# Patient Record
Sex: Female | Born: 1975 | Race: Asian | Hispanic: No | Marital: Married | Smoking: Never smoker
Health system: Southern US, Community
[De-identification: ages and names within clinical notes are randomized; demographics above are authoritative.]

## PROBLEM LIST (undated history)

## (undated) DIAGNOSIS — Z789 Other specified health status: Secondary | ICD-10-CM

---

## 2015-08-03 NOTE — H&P (Signed)
  Julie Fry is a 39 y.o. female here for Initial Prenatal Visit .missed ab documented on u/s today9 week fetus , no FHM   Past Medical History:  has no past medical history on file.  Past Surgical History:  has no past surgical history on file. Family History: family history includes No Known Problems in her father and mother. Social History:  reports that she has never smoked. She does not have any smokeless tobacco history on file. She reports that she does not drink alcohol or use illicit drugs. OB/GYN History:  OB History    Gravida Para Term Preterm AB TAB SAB Ectopic Multiple Living   2 1 1  0 0 0 0 0 0 1      Allergies: has No Known Allergies. Medications:  Current Outpatient Prescriptions:  . prenatal vitamin-iron-FA-DHA (PRENATE DHA) 27-1-300 mg capsule, Take 1 capsule by mouth once daily., Disp: , Rfl:   Review of Systems: General:   No fatigue or weight loss Eyes:   No vision changes Ears:   No hearing difficulty Respiratory:   No cough or shortness of breath Pulmonary:   No asthma or shortness of breath Cardiovascular:  No chest pain, palpitations, dyspnea on exertion Gastrointestinal:  No abdominal bloating, chronic diarrhea, constipations, masses, pain or hematochezia Genitourinary:  No hematuria, dysuria, abnormal vaginal discharge, pelvic pain, Menometrorrhagia Lymphatic:  No swollen lymph nodes Musculoskeletal: No muscle weakness Neurologic:  No extremity weakness, syncope, seizure disorder Psychiatric:  No history of depression, delusions or suicidal/homicidal ideation   Exam:      Vitals:   08/03/15 0818  BP: 105/64  Pulse: 67    Body mass index is 21.61 kg/(m^2).  WDWN asian  female in NAD  Lungs: CTA  CV : RRR without murmur  Breast: exam done in sitting and lying position : No dimpling or retraction, no dominant mass, no spontaneous discharge, no axillary adenopathy Neck: no  thyromegaly Abdomen: soft , no mass, normal active bowel sounds, non-tender, no rebound tenderness Pelvic: tanner stage 5 ,  External genitalia: vulva /labia no lesions Urethra: no prolapse Vagina: normal physiologic d/c Cervix: no lesions, no cervical motion tenderness  Uterus: 10 weeksAdnexa: no mass, non-tender   Impression:   Missed Abortion  Spoke to husband Facilities manager) and the pt about the findings .    Plan:    Suction D+C . Risks of the procedure d/w the pt

## 2015-08-07 ENCOUNTER — Ambulatory Visit: Payer: BLUE CROSS/BLUE SHIELD | Admitting: Anesthesiology

## 2015-08-07 ENCOUNTER — Ambulatory Visit
Admission: RE | Admit: 2015-08-07 | Discharge: 2015-08-07 | Disposition: A | Discharge status: Home / Self Care | Payer: BLUE CROSS/BLUE SHIELD | Source: Ambulatory Visit | Attending: Obstetrics and Gynecology | Admitting: Obstetrics and Gynecology

## 2015-08-07 ENCOUNTER — Encounter: Payer: Self-pay | Admitting: *Deleted

## 2015-08-07 ENCOUNTER — Encounter
Admission: RE | Disposition: A | Discharge status: Home / Self Care | Payer: Self-pay | Source: Ambulatory Visit | Attending: Obstetrics and Gynecology

## 2015-08-07 ENCOUNTER — Other Ambulatory Visit: Payer: Self-pay

## 2015-08-07 DIAGNOSIS — O021 Missed abortion: Secondary | ICD-10-CM | POA: Insufficient documentation

## 2015-08-07 HISTORY — PX: DILATION AND EVACUATION: SHX1459

## 2015-08-07 LAB — TYPE AND SCREEN
ABO/RH(D): O POS
Antibody Screen: NEGATIVE

## 2015-08-07 LAB — BASIC METABOLIC PANEL
Anion gap: 3 — ABNORMAL LOW (ref 5–15)
BUN: 8 mg/dL (ref 6–20)
CALCIUM: 9.3 mg/dL (ref 8.9–10.3)
CO2: 28 mmol/L (ref 22–32)
CREATININE: 0.45 mg/dL (ref 0.44–1.00)
Chloride: 105 mmol/L (ref 101–111)
GFR calc Af Amer: >60 mL/min (ref >60)
GFR calc non Af Amer: >60 mL/min (ref >60)
GLUCOSE: 97 mg/dL (ref 65–99)
Potassium: 3.5 mmol/L (ref 3.5–5.1)
Sodium: 136 mmol/L (ref 135–145)

## 2015-08-07 LAB — CBC
HEMATOCRIT: 41.2 % (ref 35.0–47.0)
Hemoglobin: 13.8 g/dL (ref 12.0–16.0)
MCH: 29.4 pg (ref 26.0–34.0)
MCHC: 33.6 g/dL (ref 32.0–36.0)
MCV: 87.4 fL (ref 80.0–100.0)
Platelets: 251 10*3/uL (ref 150–440)
RBC: 4.71 MIL/uL (ref 3.80–5.20)
RDW: 13.4 % (ref 11.5–14.5)
WBC: 8.4 10*3/uL (ref 3.6–11.0)

## 2015-08-07 LAB — ABO/RH: ABO/RH(D): O POS

## 2015-08-07 SURGERY — DILATION AND EVACUATION, UTERUS
Anesthesia: General

## 2015-08-07 MED ORDER — METHYLERGONOVINE MALEATE 0.2 MG/ML IJ SOLN
INTRAMUSCULAR | Status: DC | PRN
  Administered 2015-08-07: 0.2 mg via INTRAMUSCULAR

## 2015-08-07 MED ORDER — ONDANSETRON HCL 4 MG/2ML IJ SOLN
INTRAMUSCULAR | Status: DC | PRN
  Administered 2015-08-07: 4 mg via INTRAVENOUS

## 2015-08-07 MED ORDER — METHYLERGONOVINE MALEATE 0.2 MG/ML IJ SOLN
INTRAMUSCULAR | Status: AC
  Filled 2015-08-07: qty 1

## 2015-08-07 MED ORDER — LIDOCAINE HCL (CARDIAC) 20 MG/ML IV SOLN
INTRAVENOUS | Status: DC | PRN
  Administered 2015-08-07: 30 mg via INTRAVENOUS

## 2015-08-07 MED ORDER — LACTATED RINGERS IV SOLN
INTRAVENOUS | Status: DC
  Administered 2015-08-07 (×2): via INTRAVENOUS

## 2015-08-07 MED ORDER — METHYLERGONOVINE MALEATE 0.2 MG/ML IJ SOLN: 0.2000 mg | Freq: Once | INTRAMUSCULAR | Status: DC

## 2015-08-07 MED ORDER — SILVER NITRATE-POT NITRATE 75-25 % EX MISC
CUTANEOUS | Status: AC
  Filled 2015-08-07: qty 6

## 2015-08-07 MED ORDER — MIDAZOLAM HCL 2 MG/2ML IJ SOLN
INTRAMUSCULAR | Status: DC | PRN
Start: 2015-08-07 — End: 2015-08-07
  Administered 2015-08-07: 2 mg via INTRAVENOUS

## 2015-08-07 MED ORDER — DEXAMETHASONE SODIUM PHOSPHATE 4 MG/ML IJ SOLN
INTRAMUSCULAR | Status: DC | PRN
  Administered 2015-08-07: 4 mg via INTRAVENOUS

## 2015-08-07 MED ORDER — FENTANYL CITRATE (PF) 100 MCG/2ML IJ SOLN
INTRAMUSCULAR | Status: DC | PRN
  Administered 2015-08-07: 100 ug via INTRAVENOUS

## 2015-08-07 MED ORDER — PROPOFOL 10 MG/ML IV BOLUS
INTRAVENOUS | Status: DC | PRN
Start: 2015-08-07 — End: 2015-08-07
  Administered 2015-08-07: 150 mg via INTRAVENOUS

## 2015-08-07 MED ORDER — KETOROLAC TROMETHAMINE 30 MG/ML IJ SOLN
INTRAMUSCULAR | Status: DC | PRN
  Administered 2015-08-07: 30 mg via INTRAVENOUS

## 2015-08-07 SURGICAL SUPPLY — 20 items
CATH ROBINSON RED A/P 16FR (CATHETERS) ×2 IMPLANT
FILTER UTR ASPR SPEC (MISCELLANEOUS) ×1 IMPLANT
FLTR UTR ASPR SPEC (MISCELLANEOUS) ×2
GLOVE BIO SURGEON STRL SZ8 (GLOVE) ×2 IMPLANT
GOWN STRL REUS W/ TWL LRG LVL3 (GOWN DISPOSABLE) ×1 IMPLANT
GOWN STRL REUS W/ TWL XL LVL3 (GOWN DISPOSABLE) ×1 IMPLANT
GOWN STRL REUS W/TWL LRG LVL3 (GOWN DISPOSABLE) ×1
GOWN STRL REUS W/TWL XL LVL3 (GOWN DISPOSABLE) ×1
KIT RM TURNOVER CYSTO AR (KITS) ×2 IMPLANT
PACK DNC HYST (MISCELLANEOUS) ×2 IMPLANT
PAD OB MATERNITY 4.3X12.25 (PERSONAL CARE ITEMS) ×2 IMPLANT
PAD PREP 24X41 OB/GYN DISP (PERSONAL CARE ITEMS) ×2 IMPLANT
SET BERKELEY SUCTION TUBING (SUCTIONS) ×2 IMPLANT
TOWEL OR 17X26 4PK STRL BLUE (TOWEL DISPOSABLE) ×2 IMPLANT
VACURETTE 10 RIGID CVD (CANNULA) ×2 IMPLANT
VACURETTE 6 ASPIR F TIP BERK (CANNULA) ×2 IMPLANT
VACURETTE 7MM F TIP (CANNULA) ×1
VACURETTE 7MM F TIP STRL (CANNULA) ×1 IMPLANT
VACURETTE 8 RIGID CVD (CANNULA) ×2 IMPLANT
VACURETTE 8MM F TIP (MISCELLANEOUS) ×2 IMPLANT

## 2015-08-07 NOTE — Brief Op Note (Signed)
08/07/2015  2:44 PM  PATIENT:  Brayton Caves Hengel  39 y.o. female  PRE-OPERATIVE DIAGNOSIS:  MISSED AB  POST-OPERATIVE DIAGNOSIS:  Same as above  PROCEDURE:  Dilation and curretage SURGEON:  Surgeon(s) and Role:    Boykin Nearing, MD - Primary  PHYSICIAN ASSISTANT:   ASSISTANTS: none   ANESTHESIA:   GETA  EBL:  Total I/O In: 600 [I.V.:600] Out: 20 [Blood:20]  BLOOD ADMINISTERED:none  DRAINS: none   LOCAL MEDICATIONS USED:  NONE  SPECIMEN:  Source of Specimen:  products of conception   DISPOSITION OF SPECIMEN:  PATHOLOGY  COUNTS:  YES  TOURNIQUET:  * No tourniquets in log *  DICTATION: .Other Dictation: Dictation Number dictated  PLAN OF CARE: Discharge to home after PACU  PATIENT DISPOSITION:  PACU - hemodynamically stable.   Delay start of Pharmacological VTE agent (>24hrs) due to surgical blood loss or risk of bleeding: not applicable

## 2015-08-07 NOTE — Discharge Instructions (Signed)
AMBULATORY SURGERY  DISCHARGE INSTRUCTIONS   1) The drugs that you were given will stay in your system until tomorrow so for the next 24 hours you should not:  A) Drive an automobile B) Make any legal decisions C) Drink any alcoholic beverage   2) You may resume regular meals tomorrow.  Today it is better to start with liquids and gradually work up to solid foods.  You may eat anything you prefer, but it is better to start with liquids, then soup and crackers, and gradually work up to solid foods.   3) Please notify your doctor immediately if you have any unusual bleeding, trouble breathing, redness and pain at the surgery site, drainage, fever, or pain not relieved by medication.    4) Additional Instructions:        Please contact your physician with any problems or Same Day Surgery at [phone], Monday through Friday 6 am to 4 pm, or [place] at [facility] number at [phone].Dilation and Curettage or Vacuum Curettage, Care After Refer to this sheet in the next few weeks. These instructions provide you with information on caring for yourself after your procedure. Your health care provider may also give you more specific instructions. Your treatment has been planned according to current medical practices, but problems sometimes occur. Call your health care provider if you have any problems or questions after your procedure. WHAT TO EXPECT AFTER THE PROCEDURE After your procedure, it is typical to have light cramping and bleeding. This may last for 2 days to 2 weeks after the procedure. HOME CARE INSTRUCTIONS   Do not drive for 24 hours.  Wait 1 week before returning to strenuous activities.  Take your temperature 2 times a day for 4 days and write it down. Provide these temperatures to your health care provider if you develop a fever.  Avoid long periods of standing.  Avoid heavy lifting, pushing, or pulling. Do not lift anything heavier than 10 pounds (4.5  kg).  Limit stair climbing to once or twice a day.  Take rest periods often.  You may resume your usual diet.  Drink enough fluids to keep your urine clear or pale yellow.  Your usual bowel function should return. If you have constipation, you may:  Take a mild laxative with permission from your health care provider.  Add fruit and bran to your diet.  Drink more fluids.  Take showers instead of baths until your health care provider gives you permission to take baths.  Do not go swimming or use a hot tub until your health care provider approves.  Try to have someone with you or available to you the first 24-48 hours, especially if you were given a general anesthetic.  Do not douche, use tampons, or have sex (intercourse) for 2 weeks after the procedure.  Only take over-the-counter or prescription medicines as directed by your health care provider. Do not take aspirin. It can cause bleeding.  Follow up with your health care provider as directed. SEEK MEDICAL CARE IF:   You have increasing cramps or pain that is not relieved with medicine.  You have abdominal pain that does not seem to be related to the same area of earlier cramping and pain.  You have bad smelling vaginal discharge.  You have a rash.  You are having problems with any medicine. SEEK IMMEDIATE MEDICAL CARE IF:   You have bleeding that is heavier than a normal menstrual period.  You have a fever.  You  have chest pain.  You have shortness of breath.  You feel dizzy or feel like fainting.  You pass out.  You have pain in your shoulder strap area.  You have heavy vaginal bleeding with or without blood clots. MAKE SURE YOU:   Understand these instructions.  Will watch your condition.  Will get help right away if you are not doing well or get worse.   This information is not intended to replace advice given to you by your health care provider. Make sure you discuss any questions you have with  your health care provider.   Document Released: 09/06/2000 Document Revised: 09/14/2013 Document Reviewed: 04/08/2013 Elsevier Interactive Patient Education [employer].

## 2015-08-07 NOTE — Progress Notes (Signed)
Pt interviewed today . Husband translated . All questions answered . LAbs reviewed . NPO . Ready for surgery

## 2015-08-07 NOTE — Op Note (Signed)
[name], Fry         ACCOUNT NO.:  [id number]  MEDICAL RECORD NO.:  [id number]  LOCATION:  [facility]                         FACILITY:  [facility]  PHYSICIAN:  Laverta Baltimore, MDDATE OF BIRTH:  1976-07-25  DATE OF PROCEDURE: DATE OF DISCHARGE:  08/07/2015                              OPERATIVE REPORT   PREOPERATIVE DIAGNOSIS:  Missed abortion.  POSTOPERATIVE DIAGNOSIS:  Missed abortion.  PROCEDURE:  Suction dilation and curettage.  ANESTHESIA:  General endotracheal anesthesia.  SURGEON:  Laverta Baltimore, MD.  DESCRIPTION OF PROCEDURE:  After general endotracheal anesthesia, the patient was placed in a dorsal supine position.  Legs in the candy-cane stirrups.  Lower abdomen, perineum, vagina were prepped and draped in normal sterile fashion.  A weighted speculum was placed in the posterior vaginal vault.  A time-out was performed.  Bladder was drained, yielded 100 mL clear urine.  Anterior cervix was grasped with single-tooth tenaculum.  The cervix was dilated to a #20 Hanks dilator without difficulty.  A #8 flexible suction curette was placed in the endometrial cavity, and tissue was removed consistent with products of conception. Sharp curettage performed with no additional tissue.  Repeat suction curettage with no additional tissue.  Hemostasis was good.  The patient did receive 0.2 mg intramuscular methargen at the end of the case.  COMPLICATIONS:  There were no complications.  ESTIMATED BLOOD LOSS:  20 mL.  INTRAOPERATIVE FLUIDS:  600 mL.  URINE OUTPUT:  100 mL.  DISPOSITION:  The patient was taken to recovery room in good condition.          ______________________________ Laverta Baltimore, MD    TS/MEDQ  D:  08/07/2015  T:  08/07/2015  Job:  [id number]

## 2015-08-07 NOTE — OR Nursing (Signed)
Interpreter used via Associate Professor for admission questions

## 2015-08-07 NOTE — Transfer of Care (Signed)
Immediate Anesthesia Transfer of Care Note  Patient: Julie Fry  Procedure(s) Performed: Procedure(s): DILATATION AND EVACUATION (N/A)  Patient Location: PACU  Anesthesia Type:General  Level of Consciousness: awake, alert  and oriented  Airway & Oxygen Therapy: Patient Spontanous Breathing and Patient connected to face mask oxygen  Post-op Assessment: Report given to RN and Post -op Vital signs reviewed and stable  Post vital signs: Reviewed and stable  Last Vitals:  Filed Vitals:   08/07/15 1459  BP: 111/73  Pulse: 52  Temp: 36.8 C  Resp: 14    Complications: No apparent anesthesia complications

## 2015-08-07 NOTE — Anesthesia Preprocedure Evaluation (Signed)
Anesthesia Evaluation  Patient identified by MRN, date of birth, ID band Patient awake    Airway Mallampati: I       Dental no notable dental hx.    Pulmonary neg pulmonary ROS,    Pulmonary exam normal        Cardiovascular negative cardio ROS   Rhythm:Regular Rate:Normal     Neuro/Psych    GI/Hepatic negative GI ROS, Neg liver ROS,   Endo/Other  negative endocrine ROS  Renal/GU negative Renal ROS     Musculoskeletal negative musculoskeletal ROS (+)   Abdominal Normal abdominal exam  (+)   Peds negative pediatric ROS (+)  Hematology negative hematology ROS (+)   Anesthesia Other Findings   Reproductive/Obstetrics                             Anesthesia Physical Anesthesia Plan  ASA: I  Anesthesia Plan: General   Post-op Pain Management:    Induction: Intravenous  Airway Management Planned: LMA  Additional Equipment:   Intra-op Plan:   Post-operative Plan: Extubation in OR  Informed Consent: I have reviewed the patients History and Physical, chart, labs and discussed the procedure including the risks, benefits and alternatives for the proposed anesthesia with the patient or authorized representative who has indicated his/her understanding and acceptance.     Plan Discussed with: CRNA  Anesthesia Plan Comments:         Anesthesia Quick Evaluation

## 2015-08-08 ENCOUNTER — Encounter: Payer: Self-pay | Admitting: Obstetrics and Gynecology

## 2015-08-08 NOTE — Anesthesia Postprocedure Evaluation (Signed)
  Anesthesia Post-op Note  Patient: Financial planner  Procedure(s) Performed: Procedure(s): DILATATION AND EVACUATION (N/A)  Anesthesia type:General  Patient location: PACU  Post pain: Pain level controlled  Post assessment: Post-op Vital signs reviewed, Patient's Cardiovascular Status Stable, Respiratory Function Stable, Patent Airway and No signs of Nausea or vomiting  Post vital signs: Reviewed and stable  Last Vitals:  Filed Vitals:   08/07/15 1602  BP: 131/75  Pulse: 71  Temp: 36.2 C  Resp:     Level of consciousness: awake, alert  and patient cooperative  Complications: No apparent anesthesia complications

## 2015-08-09 LAB — SURGICAL PATHOLOGY

## 2015-12-07 ENCOUNTER — Encounter: Payer: Self-pay | Admitting: Family Medicine

## 2016-12-30 DIAGNOSIS — Z3201 Encounter for pregnancy test, result positive: Secondary | ICD-10-CM | POA: Diagnosis not present

## 2016-12-30 DIAGNOSIS — Z3200 Encounter for pregnancy test, result unknown: Secondary | ICD-10-CM | POA: Diagnosis not present

## 2017-01-14 ENCOUNTER — Encounter: Payer: Self-pay | Admitting: Family Medicine

## 2017-01-14 ENCOUNTER — Ambulatory Visit (INDEPENDENT_AMBULATORY_CARE_PROVIDER_SITE_OTHER): Payer: BLUE CROSS/BLUE SHIELD | Admitting: Family Medicine

## 2017-01-14 VITALS — BP 102/68 | HR 81 | Temp 98.5°F | Resp 14 | Ht 59.0 in | Wt 107.8 lb

## 2017-01-14 DIAGNOSIS — N926 Irregular menstruation, unspecified: Secondary | ICD-10-CM

## 2017-01-14 DIAGNOSIS — Z3201 Encounter for pregnancy test, result positive: Secondary | ICD-10-CM

## 2017-01-14 DIAGNOSIS — Z0000 Encounter for general adult medical examination without abnormal findings: Secondary | ICD-10-CM | POA: Diagnosis not present

## 2017-01-14 LAB — POCT URINE PREGNANCY: Preg Test, Ur: POSITIVE — AB

## 2017-01-14 MED ORDER — PRENATAL VITAMIN 27-0.8 MG PO TABS: 1.0000 | ORAL_TABLET | Freq: Every day | ORAL | 0 refills | Status: DC

## 2017-01-14 NOTE — Assessment & Plan Note (Signed)
USPSTF grade A and B recommendations reviewed with patient; age-appropriate recommendations, preventive care, screening tests, etc discussed and encouraged; healthy living encouraged; see AVS for patient education given to patient  

## 2017-01-14 NOTE — Patient Instructions (Addendum)
Preparing for Pregnancy If you are considering becoming pregnant, make an appointment to see your regular health care provider to learn how to prepare for a safe and healthy pregnancy (preconception care). During a preconception care visit, your health care provider will:  Do a complete physical exam, including a Pap test.  Take a complete medical history.  Give you information, answer your questions, and help you resolve problems.  Preconception checklist Medical history  Tell your health care provider about any current or past medical conditions. Your pregnancy or your ability to become pregnant may be affected by chronic conditions, such as diabetes, chronic hypertension, and thyroid problems.  Include your family's medical history as well as your partner's medical history.  Tell your health care provider about any history of STIs (sexually transmitted infections).These can affect your pregnancy. In some cases, they can be passed to your baby. Discuss any concerns that you have about STIs.  If indicated, discuss the benefits of genetic testing. This testing will show whether there are any genetic conditions that may be passed from you or your partner to your baby.  Tell your health care provider about: ? Any problems you have had with conception or pregnancy. ? Any medicines you take. These include vitamins, herbal supplements, and over-the-counter medicines. ? Your history of immunizations. Discuss any vaccinations that you may need.  Diet  Ask your health care provider what to include in a healthy diet that has a balance of nutrients. This is especially important when you are pregnant or preparing to become pregnant.  Ask your health care provider to help you reach a healthy weight before pregnancy. ? If you are overweight, you may be at higher risk for certain complications, such as high blood pressure, diabetes, and preterm birth. ? If you are underweight, you are more likely  to have a baby who has a low birth weight.  Lifestyle, work, and home  Let your health care provider know: ? About any lifestyle habits that you have, such as alcohol use, drug use, or smoking. ? About recreational activities that may put you at risk during pregnancy, such as downhill skiing and certain exercise programs. ? Tell your health care provider about any international travel, especially any travel to places with an active [place] virus outbreak. ? About harmful substances that you may be exposed to at work or at home. These include chemicals, pesticides, radiation, or even litter boxes. ? If you do not feel safe at home.  Mental health  Tell your health care provider about: ? Any history of mental health conditions, including feelings of depression, sadness, or anxiety. ? Any medicines that you take for a mental health condition. These include herbs and supplements.  Home instructions to prepare for pregnancy Lifestyle  Eat a balanced diet. This includes fresh fruits and vegetables, whole grains, lean meats, low-fat dairy products, healthy fats, and foods that are high in fiber. Ask to meet with a nutritionist or registered dietitian for assistance with meal planning and goals.  Get regular exercise. Try to be active for at least 30 minutes a day on most days of the week. Ask your health care provider which activities are safe during pregnancy.  Do not use any products that contain nicotine or tobacco, such as cigarettes and e-cigarettes. If you need help quitting, ask your health care provider.  Do not drink alcohol.  Do not take illegal drugs.  Maintain a healthy weight. Ask your health care provider what weight range is   instructions  Keep an accurate record of your menstrual periods. This makes it easier for your health care provider to determine your baby's due date.  Begin taking prenatal vitamins and folic acid supplements daily as directed by your  health care provider.  Manage any chronic conditions, such as high blood pressure and diabetes, as told by your health care provider. This is important. How do I know that I am pregnant? You may be pregnant if you have been sexually active and you miss your period. Symptoms of early pregnancy include:  Mild cramping.  Very light vaginal bleeding (spotting).  Feeling unusually tired.  Nausea and vomiting (morning sickness). If you have any of these symptoms and you suspect that you might be pregnant, you can take a home pregnancy test. These tests check for a hormone in your urine (human chorionic gonadotropin, or hCG). A woman's body begins to make this hormone during early pregnancy. These tests are very accurate. Wait until at least the first day after you miss your period to take one. If the test shows that you are pregnant (you get a positive result), call your health care provider to make an appointment for prenatal care. What should I do if I become pregnant?  Make an appointment with your health care provider as soon as you suspect you are pregnant.  Do not use any products that contain nicotine, such as cigarettes, chewing tobacco, and e-cigarettes. If you need help quitting, ask your health care provider.  Do not drink alcoholic beverages. Alcohol is related to a number of birth defects.  Avoid toxic odors and chemicals.  You may continue to have sexual intercourse if it does not cause pain or other problems, such as vaginal bleeding. This information is not intended to replace advice given to you by your health care provider. Make sure you discuss any questions you have with your health care provider. Document Released: 08/22/2008 Document Revised: 05/07/2016 Document Reviewed: 03/31/2016 Elsevier Interactive Patient Education  2017 [employer].  Preparing for Pregnancy If you are considering becoming pregnant, make an appointment to see your regular health care provider to  learn how to prepare for a safe and healthy pregnancy (preconception care). During a preconception care visit, your health care provider will:  Do a complete physical exam, including a Pap test.  Take a complete medical history.  Give you information, answer your questions, and help you resolve problems. Preconception checklist Medical history  Tell your health care provider about any current or past medical conditions. Your pregnancy or your ability to become pregnant may be affected by chronic conditions, such as diabetes, chronic hypertension, and thyroid problems.  Include your family's medical history as well as your partner's medical history.  Tell your health care provider about any history of STIs (sexually transmitted infections).These can affect your pregnancy. In some cases, they can be passed to your baby. Discuss any concerns that you have about STIs.  If indicated, discuss the benefits of genetic testing. This testing will show whether there are any genetic conditions that may be passed from you or your partner to your baby.  Tell your health care provider about:  Any problems you have had with conception or pregnancy.  Any medicines you take. These include vitamins, herbal supplements, and over-the-counter medicines.  Your history of immunizations. Discuss any vaccinations that you may need. Diet  Ask your health care provider what to include in a healthy diet that has a balance of nutrients. This is especially important  when you are pregnant or preparing to become pregnant.  Ask your health care provider to help you reach a healthy weight before pregnancy.  If you are overweight, you may be at higher risk for certain complications, such as high blood pressure, diabetes, and preterm birth.  If you are underweight, you are more likely to have a baby who has a low birth weight. Lifestyle, work, and home  Let your health care provider know:  About any lifestyle  habits that you have, such as alcohol use, drug use, or smoking.  About recreational activities that may put you at risk during pregnancy, such as downhill skiing and certain exercise programs.  Tell your health care provider about any international travel, especially any travel to places with an active [place] virus outbreak.  About harmful substances that you may be exposed to at work or at home. These include chemicals, pesticides, radiation, or even litter boxes.  If you do not feel safe at home. Mental health  Tell your health care provider about:  Any history of mental health conditions, including feelings of depression, sadness, or anxiety.  Any medicines that you take for a mental health condition. These include herbs and supplements. Home instructions to prepare for pregnancy  Lifestyle  Eat a balanced diet. This includes fresh fruits and vegetables, whole grains, lean meats, low-fat dairy products, healthy fats, and foods that are high in fiber. Ask to meet with a nutritionist or registered dietitian for assistance with meal planning and goals.  Get regular exercise. Try to be active for at least 30 minutes a day on most days of the week. Ask your health care provider which activities are safe during pregnancy.  Do not use any products that contain nicotine or tobacco, such as cigarettes and e-cigarettes. If you need help quitting, ask your health care provider.  Do not drink alcohol.  Do not take illegal drugs.  Maintain a healthy weight. Ask your health care provider what weight range is right for you. General instructions  Keep an accurate record of your menstrual periods. This makes it easier for your health care provider to determine your baby's due date.  Begin taking prenatal vitamins and folic acid supplements daily as directed by your health care provider.  Manage any chronic conditions, such as high blood pressure and diabetes, as told by your health care  provider. This is important. How do I know that I am pregnant? You may be pregnant if you have been sexually active and you miss your period. Symptoms of early pregnancy include:  Mild cramping.  Very light vaginal bleeding (spotting).  Feeling unusually tired.  Nausea and vomiting (morning sickness). If you have any of these symptoms and you suspect that you might be pregnant, you can take a home pregnancy test. These tests check for a hormone in your urine (human chorionic gonadotropin, or hCG). A woman's body begins to make this hormone during early pregnancy. These tests are very accurate. Wait until at least the first day after you miss your period to take one. If the test shows that you are pregnant (you get a positive result), call your health care provider to make an appointment for prenatal care. What should I do if I become pregnant?  Make an appointment with your health care provider as soon as you suspect you are pregnant.  Do not use any products that contain nicotine, such as cigarettes, chewing tobacco, and e-cigarettes. If you need help quitting, ask your health care provider.  Do not drink alcoholic beverages. Alcohol is related to a number of birth defects.  Avoid toxic odors and chemicals.  You may continue to have sexual intercourse if it does not cause pain or other problems, such as vaginal bleeding. This information is not intended to replace advice given to you by your health care provider. Make sure you discuss any questions you have with your health care provider. Document Released: 08/22/2008 Document Revised: 05/07/2016 Document Reviewed: 03/31/2016 Elsevier Interactive Patient Education  2017 [place] Maintenance, Female Adopting a healthy lifestyle and getting preventive care can go a long way to promote health and wellness. Talk with your health care provider about what schedule of regular examinations is right for you. This is a good chance for  you to check in with your provider about disease prevention and staying healthy. In between checkups, there are plenty of things you can do on your own. Experts have done a lot of research about which lifestyle changes and preventive measures are most likely to keep you healthy. Ask your health care provider for more information. Weight and diet Eat a healthy diet  Be sure to include plenty of vegetables, fruits, low-fat dairy products, and lean protein.  Do not eat a lot of foods high in solid fats, added sugars, or salt.  Get regular exercise. This is one of the most important things you can do for your health.  Most adults should exercise for at least 150 minutes each week. The exercise should increase your heart rate and make you sweat (moderate-intensity exercise).  Most adults should also do strengthening exercises at least twice a week. This is in addition to the moderate-intensity exercise. Maintain a healthy weight  Body mass index (BMI) is a measurement that can be used to identify possible weight problems. It estimates body fat based on height and weight. Your health care provider can help determine your BMI and help you achieve or maintain a healthy weight.  For females 29 years of age and older:  A BMI below 18.5 is considered underweight.  A BMI of 18.5 to 24.9 is normal.  A BMI of 25 to 29.9 is considered overweight.  A BMI of 30 and above is considered obese. Watch levels of cholesterol and blood lipids  You should start having your blood tested for lipids and cholesterol at 41 years of age, then have this test every 5 years.  You may need to have your cholesterol levels checked more often if:  Your lipid or cholesterol levels are high.  You are older than 41 years of age.  You are at high risk for heart disease. Cancer screening Lung Cancer  Lung cancer screening is recommended for adults 45-49 years old who are at high risk for lung cancer because of a  history of smoking.  A yearly low-dose CT scan of the lungs is recommended for people who:  Currently smoke.  Have quit within the past 15 years.  Have at least a 30-pack-year history of smoking. A pack year is smoking an average of one pack of cigarettes a day for 1 year.  Yearly screening should continue until it has been 15 years since you quit.  Yearly screening should stop if you develop a health problem that would prevent you from having lung cancer treatment. Breast Cancer  Practice breast self-awareness. This means understanding how your breasts normally appear and feel.  It also means doing regular breast self-exams. Let your health care provider know about any  changes, no matter how small.  If you are in your 20s or 30s, you should have a clinical breast exam (CBE) by a health care provider every 1-3 years as part of a regular health exam.  If you are 74 or older, have a CBE every year. Also consider having a breast X-ray (mammogram) every year.  If you have a family history of breast cancer, talk to your health care provider about genetic screening.  If you are at high risk for breast cancer, talk to your health care provider about having an MRI and a mammogram every year.  Breast cancer gene (BRCA) assessment is recommended for women who have family members with BRCA-related cancers. BRCA-related cancers include:  Breast.  Ovarian.  Tubal.  Peritoneal cancers.  Results of the assessment will determine the need for genetic counseling and BRCA1 and BRCA2 testing. Cervical Cancer  Your health care provider may recommend that you be screened regularly for cancer of the pelvic organs (ovaries, uterus, and vagina). This screening involves a pelvic examination, including checking for microscopic changes to the surface of your cervix (Pap test). You may be encouraged to have this screening done every 3 years, beginning at age 4.  For women ages 2-65, health care  providers may recommend pelvic exams and Pap testing every 3 years, or they may recommend the Pap and pelvic exam, combined with testing for human papilloma virus (HPV), every 5 years. Some types of HPV increase your risk of cervical cancer. Testing for HPV may also be done on women of any age with unclear Pap test results.  Other health care providers may not recommend any screening for nonpregnant women who are considered low risk for pelvic cancer and who do not have symptoms. Ask your health care provider if a screening pelvic exam is right for you.  If you have had past treatment for cervical cancer or a condition that could lead to cancer, you need Pap tests and screening for cancer for at least 20 years after your treatment. If Pap tests have been discontinued, your risk factors (such as having a new sexual partner) need to be reassessed to determine if screening should resume. Some women have medical problems that increase the chance of getting cervical cancer. In these cases, your health care provider may recommend more frequent screening and Pap tests. Colorectal Cancer  This type of cancer can be detected and often prevented.  Routine colorectal cancer screening usually begins at 41 years of age and continues through 41 years of age.  Your health care provider may recommend screening at an earlier age if you have risk factors for colon cancer.  Your health care provider may also recommend using home test kits to check for hidden blood in the stool.  A small camera at the end of a tube can be used to examine your colon directly (sigmoidoscopy or colonoscopy). This is done to check for the earliest forms of colorectal cancer.  Routine screening usually begins at age 42.  Direct examination of the colon should be repeated every 5-10 years through 41 years of age. However, you may need to be screened more often if early forms of precancerous polyps or small growths are found. Skin  Cancer  Check your skin from head to toe regularly.  Tell your health care provider about any new moles or changes in moles, especially if there is a change in a mole's shape or color.  Also tell your health care provider if you have  a mole that is larger than the size of a pencil eraser.  Always use sunscreen. Apply sunscreen liberally and repeatedly throughout the day.  Protect yourself by wearing long sleeves, pants, a wide-brimmed hat, and sunglasses whenever you are outside. Heart disease, diabetes, and high blood pressure  High blood pressure causes heart disease and increases the risk of stroke. High blood pressure is more likely to develop in:  People who have blood pressure in the high end of the normal range (130-139/85-89 mm Hg).  People who are overweight or obese.  People who are African American.  If you are 73-34 years of age, have your blood pressure checked every 3-5 years. If you are 28 years of age or older, have your blood pressure checked every year. You should have your blood pressure measured twice-once when you are at a hospital or clinic, and once when you are not at a hospital or clinic. Record the average of the two measurements. To check your blood pressure when you are not at a hospital or clinic, you can use:  An automated blood pressure machine at a pharmacy.  A home blood pressure monitor.  If you are between 52 years and 49 years old, ask your health care provider if you should take aspirin to prevent strokes.  Have regular diabetes screenings. This involves taking a blood sample to check your fasting blood sugar level.  If you are at a normal weight and have a low risk for diabetes, have this test once every three years after 41 years of age.  If you are overweight and have a high risk for diabetes, consider being tested at a younger age or more often. Preventing infection Hepatitis B  If you have a higher risk for hepatitis B, you should be  screened for this virus. You are considered at high risk for hepatitis B if:  You were born in a country where hepatitis B is common. Ask your health care provider which countries are considered high risk.  Your parents were born in a high-risk country, and you have not been immunized against hepatitis B (hepatitis B vaccine).  You have HIV or AIDS.  You use needles to inject street drugs.  You live with someone who has hepatitis B.  You have had sex with someone who has hepatitis B.  You get hemodialysis treatment.  You take certain medicines for conditions, including cancer, organ transplantation, and autoimmune conditions. Hepatitis C  Blood testing is recommended for:  Everyone born from [birth year] through 1965.  Anyone with known risk factors for hepatitis C. Sexually transmitted infections (STIs)  You should be screened for sexually transmitted infections (STIs) including gonorrhea and chlamydia if:  You are sexually active and are younger than 41 years of age.  You are older than 41 years of age and your health care provider tells you that you are at risk for this type of infection.  Your sexual activity has changed since you were last screened and you are at an increased risk for chlamydia or gonorrhea. Ask your health care provider if you are at risk.  If you do not have HIV, but are at risk, it may be recommended that you take a prescription medicine daily to prevent HIV infection. This is called pre-exposure prophylaxis (PrEP). You are considered at risk if:  You are sexually active and do not regularly use condoms or know the HIV status of your partner(s).  You take drugs by injection.  You are sexually active  with a partner who has HIV. Talk with your health care provider about whether you are at high risk of being infected with HIV. If you choose to begin PrEP, you should first be tested for HIV. You should then be tested every 3 months for as long as you are taking  PrEP. Pregnancy  If you are premenopausal and you may become pregnant, ask your health care provider about preconception counseling.  If you may become pregnant, take 400 to 800 micrograms (mcg) of folic acid every day.  If you want to prevent pregnancy, talk to your health care provider about birth control (contraception). Osteoporosis and menopause  Osteoporosis is a disease in which the bones lose minerals and strength with aging. This can result in serious bone fractures. Your risk for osteoporosis can be identified using a bone density scan.  If you are 70 years of age or older, or if you are at risk for osteoporosis and fractures, ask your health care provider if you should be screened.  Ask your health care provider whether you should take a calcium or vitamin D supplement to lower your risk for osteoporosis.  Menopause may have certain physical symptoms and risks.  Hormone replacement therapy may reduce some of these symptoms and risks. Talk to your health care provider about whether hormone replacement therapy is right for you. Follow these instructions at home:  Schedule regular health, dental, and eye exams.  Stay current with your immunizations.  Do not use any tobacco products including cigarettes, chewing tobacco, or electronic cigarettes.  If you are pregnant, do not drink alcohol.  If you are breastfeeding, limit how much and how often you drink alcohol.  Limit alcohol intake to no more than 1 drink per day for nonpregnant women. One drink equals 12 ounces of beer, 5 ounces of wine, or 1 ounces of hard liquor.  Do not use street drugs.  Do not share needles.  Ask your health care provider for help if you need support or information about quitting drugs.  Tell your health care provider if you often feel depressed.  Tell your health care provider if you have ever been abused or do not feel safe at home. This information is not intended to replace advice given  to you by your health care provider. Make sure you discuss any questions you have with your health care provider. Document Released: 03/25/2011 Document Revised: 02/15/2016 Document Reviewed: 06/13/2015 Elsevier Interactive Patient Education  2017 [employer].

## 2017-01-14 NOTE — Progress Notes (Signed)
Patient ID: Julie Fry, female   DOB: 1976-04-29, 41 y.o.   MRN: [id number]   Subjective:   Julie Fry is a 41 y.o. female here for a complete physical exam  Interim issues since last visit: here with interpreter, speaks [place] Patient is new to me; last visit here at [facility] was in 2014 per CMA  I asked about prior pregnancies; translation issues or understanding issues, so kept it simple: last year pregnant and this year pregnant, so two total she says; no children I asked what happened to last year's pregnancy: just had a big clot of blood; she saw OB and they cleaned her out Surgical notes indicate that she had a dilatation and evacuation in Nov 2016 by OB-GYN No protection/contraception Her boyfriend is supportive Has appointment with OB-GYN already on April 30th No nausea; having some breast tenderness Not taking any prenatal vitamins; advised PNV First day of LMP was Feb 9th No alcohol  USPSTF grade A and B recommendations Depression:  Depression screen [facility] 2/9 01/14/2017  Decreased Interest 0  Down, Depressed, Hopeless 0  PHQ - 2 Score 0   Hypertension: BP Readings from Last 3 Encounters:  01/14/17 102/68  08/07/15 (P) 112/69   Obesity: Wt Readings from Last 3 Encounters:  01/14/17 107 lb 12.8 oz (48.9 kg)  08/07/15 107 lb (48.5 kg)   BMI Readings from Last 3 Encounters:  01/14/17 21.77 kg/m  08/07/15 20.22 kg/m    Alcohol: no Tobacco use: no HIV, hep B, hep C: will defer to OB STD testing and prevention (chl/gon/syphilis): defer to OB; asymtpomatic Intimate partner violence: no Breast cancer: no BRCA gene screening: no cancer Cervical cancer screening: defer to OB; no hx of abnormal pap Osteoporosis: n/a Fall prevention/vitamin D: not taking Lipids: defer to OB Glucose: defer to OB Glucose, Bld  Date Value Ref Range Status  08/07/2015 97 65 - 99 mg/dL Final   Colorectal cancer: no Lung cancer:  n/a AAA: n/a Aspirin:  no Diet: healthy diet Exercise: nothing regular Skin cancer: no  No past medical history on file. Past Surgical History:  Procedure Laterality Date  . DILATION AND EVACUATION N/A 08/07/2015   Procedure: DILATATION AND EVACUATION;  Surgeon: Boykin Nearing, MD;  Location: [facility] ORS;  Service: Gynecology;  Laterality: N/A;   Family History  Problem Relation Age of Onset  . Cancer Father    Social History  Substance Use Topics  . Smoking status: Never Smoker  . Smokeless tobacco: Never Used  . Alcohol use 0.6 oz/week    1 Glasses of wine per week   Review of Systems  Respiratory: Negative for shortness of breath.   Cardiovascular: Negative for chest pain.  Gastrointestinal: Negative for blood in stool.  Genitourinary: Negative for hematuria.    Objective:   Vitals:   01/14/17 0804  BP: 102/68  Pulse: 81  Resp: 14  Temp: 98.5 F (36.9 C)  TempSrc: Oral  SpO2: 98%  Weight: 107 lb 12.8 oz (48.9 kg)  Height: '4\' 11"'  (1.499 m)   Body mass index is 21.77 kg/m. Wt Readings from Last 3 Encounters:  01/14/17 107 lb 12.8 oz (48.9 kg)  08/07/15 107 lb (48.5 kg)   Physical Exam  Constitutional: She appears well-developed and well-nourished.  HENT:  Head: Normocephalic and atraumatic.  Right Ear: Hearing, tympanic membrane, external ear and ear canal normal.  Left Ear: Hearing, tympanic membrane, external ear and ear canal normal.  Eyes: Conjunctivae and EOM are normal. Right eye exhibits no  hordeolum. Left eye exhibits no hordeolum. No scleral icterus.  Neck: Carotid bruit is not present. No thyromegaly present.  Cardiovascular: Normal rate, regular rhythm, S1 normal, S2 normal and normal heart sounds.   No extrasystoles are present.  Pulmonary/Chest: Effort normal and breath sounds normal. No respiratory distress.  Abdominal: Soft. Normal appearance and bowel sounds are normal. She exhibits no distension, no abdominal bruit, no pulsatile midline mass and no mass.  There is no hepatosplenomegaly. There is no tenderness. No hernia.  Musculoskeletal: Normal range of motion. She exhibits no edema.  Lymphadenopathy:    She has no cervical adenopathy.  Neurological: She is alert. She displays no tremor. No cranial nerve deficit. She exhibits normal muscle tone. Gait normal.  Reflex Scores:      Patellar reflexes are 2+ on the right side and 2+ on the left side. Skin: Skin is warm and dry. No cyanosis. No pallor.  Psychiatric: Her speech is normal and behavior is normal. Thought content normal. Her mood appears not anxious. She does not exhibit a depressed mood.   Assessment/Plan:   Problem List Items Addressed This Visit      Other   Preventative health care - Primary    USPSTF grade A and B recommendations reviewed with patient; age-appropriate recommendations, preventive care, screening tests, etc discussed and encouraged; healthy living encouraged; see AVS for patient education given to patient       Other Visit Diagnoses    Late period       urine hCG checked here today, positive   Relevant Orders   POCT urine pregnancy (Completed)   Positive pregnancy test       start PNV; see OB next week; no alcohol       Meds ordered this encounter  Medications  . Prenatal Vit-Fe Fumarate-FA (PRENATAL VITAMIN) 27-0.8 MG TABS    Sig: Take 1 tablet by mouth daily.    Dispense:  30 tablet    Refill:  0   Orders Placed This Encounter  Procedures  . POCT urine pregnancy    Follow up plan: Return in about 1 year (around 01/14/2018) for complete physical.  An After Visit Summary was printed and given to the patient.

## 2017-01-20 DIAGNOSIS — O3680X0 Pregnancy with inconclusive fetal viability, not applicable or unspecified: Secondary | ICD-10-CM | POA: Diagnosis not present

## 2017-01-20 DIAGNOSIS — Z3401 Encounter for supervision of normal first pregnancy, first trimester: Secondary | ICD-10-CM | POA: Diagnosis not present

## 2017-01-21 ENCOUNTER — Other Ambulatory Visit: Payer: Self-pay | Admitting: Obstetrics and Gynecology

## 2017-01-21 DIAGNOSIS — Z369 Encounter for antenatal screening, unspecified: Secondary | ICD-10-CM

## 2017-01-30 ENCOUNTER — Ambulatory Visit: Payer: BLUE CROSS/BLUE SHIELD

## 2017-02-07 DIAGNOSIS — O3680X0 Pregnancy with inconclusive fetal viability, not applicable or unspecified: Secondary | ICD-10-CM | POA: Diagnosis not present

## 2017-02-07 DIAGNOSIS — O021 Missed abortion: Secondary | ICD-10-CM | POA: Diagnosis not present

## 2017-02-10 ENCOUNTER — Other Ambulatory Visit: Payer: BLUE CROSS/BLUE SHIELD

## 2017-09-23 NOTE — L&D Delivery Note (Signed)
Delivery Note  First Stage: Labor onset: 1130 Augmentation : Pitocin Analgesia /Anesthesia intrapartum: fentanyl 67mcg x 1 dose SROM at 0600  Second Stage: Complete dilation at 1706 Onset of pushing at 1706 FHR second stage: 135bpm, mod variability.  Delivery of a viable female infant on 04/12/2018 at 1718 by Hassan Buckler CNM delivery of fetal head in LOA  position with restitution to LOT. nuchal cord x 1;  Anterior then posterior shoulders delivered easily with gentle downward traction. Baby placed on mom's chest, and attended to by peds.  Cord double clamped after cessation of pulsation, cut by FOB Cord blood sample collected    Third Stage: Active third stage mgmt with Pitocin and gentle cord traction, Placenta delivered Spontaneously intact with [place] @ 1723 Placenta disposition: routine disposal Uterine tone Firm / bleeding small.  1st deg laceration identified  Anesthesia for repair: Local, lidocaine Repair 3-0 Vicryl SHx 1 Est. Blood Loss (mL): 837  Complications: none  Mom to postpartum.  Baby to Couplet care / Skin to Skin.  Newborn: Birth Weight: 7#9   Apgar Scores: 7/9 Feeding planned: both

## 2017-10-21 DIAGNOSIS — Z3200 Encounter for pregnancy test, result unknown: Secondary | ICD-10-CM | POA: Diagnosis not present

## 2017-10-21 DIAGNOSIS — N912 Amenorrhea, unspecified: Secondary | ICD-10-CM | POA: Diagnosis not present

## 2017-10-21 DIAGNOSIS — O09522 Supervision of elderly multigravida, second trimester: Secondary | ICD-10-CM | POA: Diagnosis not present

## 2017-10-27 ENCOUNTER — Other Ambulatory Visit: Payer: Self-pay | Admitting: Obstetrics and Gynecology

## 2017-10-27 DIAGNOSIS — Z3689 Encounter for other specified antenatal screening: Secondary | ICD-10-CM

## 2017-10-29 DIAGNOSIS — O09893 Supervision of other high risk pregnancies, third trimester: Secondary | ICD-10-CM | POA: Insufficient documentation

## 2017-10-29 DIAGNOSIS — O09899 Supervision of other high risk pregnancies, unspecified trimester: Secondary | ICD-10-CM | POA: Diagnosis not present

## 2017-10-29 DIAGNOSIS — O09529 Supervision of elderly multigravida, unspecified trimester: Secondary | ICD-10-CM | POA: Diagnosis not present

## 2017-11-13 ENCOUNTER — Encounter: Payer: Self-pay | Admitting: *Deleted

## 2017-11-13 ENCOUNTER — Ambulatory Visit
Admission: RE | Admit: 2017-11-13 | Discharge: 2017-11-13 | Disposition: A | Discharge status: Home / Self Care | Payer: BLUE CROSS/BLUE SHIELD | Source: Ambulatory Visit | Attending: Obstetrics & Gynecology | Admitting: Obstetrics & Gynecology

## 2017-11-13 ENCOUNTER — Ambulatory Visit (HOSPITAL_BASED_OUTPATIENT_CLINIC_OR_DEPARTMENT_OTHER)
Admission: RE | Admit: 2017-11-13 | Discharge: 2017-11-13 | Disposition: A | Discharge status: Home / Self Care | Payer: BLUE CROSS/BLUE SHIELD | Source: Ambulatory Visit | Attending: Obstetrics & Gynecology | Admitting: Obstetrics & Gynecology

## 2017-11-13 VITALS — BP 125/66 | HR 82 | Temp 97.9°F | Resp 18 | Wt 121.6 lb

## 2017-11-13 DIAGNOSIS — O3412 Maternal care for benign tumor of corpus uteri, second trimester: Secondary | ICD-10-CM | POA: Insufficient documentation

## 2017-11-13 DIAGNOSIS — Z3689 Encounter for other specified antenatal screening: Secondary | ICD-10-CM | POA: Diagnosis not present

## 2017-11-13 DIAGNOSIS — D251 Intramural leiomyoma of uterus: Secondary | ICD-10-CM | POA: Insufficient documentation

## 2017-11-13 DIAGNOSIS — O09522 Supervision of elderly multigravida, second trimester: Secondary | ICD-10-CM | POA: Insufficient documentation

## 2017-11-13 DIAGNOSIS — Z3A18 18 weeks gestation of pregnancy: Secondary | ICD-10-CM | POA: Diagnosis not present

## 2017-11-13 HISTORY — DX: Other specified health status: Z78.9

## 2017-11-13 NOTE — Progress Notes (Addendum)
Referring Provider:  Arnetha Courser Length of Consultation: 45 minutes  Ms. Clippinger was referred to [facility] for genetic counseling because of advanced maternal age.  The patient will be 42 years old at the time of delivery.  This note summarizes the information we discussed.    We explained that the chance of a chromosome abnormality increases with maternal age.  Chromosomes and examples of chromosome problems were reviewed.  Humans typically have 46 chromosomes in each cell, with half passed through each sperm and egg.  Any change in the number or structure of chromosomes can increase the risk of problems in the physical and mental development of a pregnancy.   Based upon age of the patient and the current gestational age, the chance of any chromosome abnormality was 1 in 69. The chance of Down syndrome, the most common chromosome problem associated with maternal age, was 1 in 110.  The risk of chromosome problems is in addition to the 3% general population risk for birth defects and mental retardation.  The greatest chance, of course, is that the baby would be born in good health.  We discussed the following prenatal screening and testing options for this pregnancy:  Maternal serum marker screening, a blood test that measures pregnancy proteins, can provide risk assessments for Down syndrome, trisomy 18, and open neural tube defects (spina bifida, anencephaly). Because it does not directly examine the fetus, it cannot positively diagnose or rule out these problems.  Targeted ultrasound uses high frequency sound waves to create an image of the developing fetus.  An ultrasound is often recommended as a routine means of evaluating the pregnancy.  It is also used to screen for fetal anatomy problems (for example, a heart defect) that might be suggestive of a chromosomal or other abnormality.   Amniocentesis involves the removal of a small amount of amniotic fluid  from the sac surrounding the fetus with the use of a thin needle inserted through the maternal abdomen and uterus.  Ultrasound guidance is used throughout the procedure.  Fetal cells from amniotic fluid are directly evaluated and > 99.5% of chromosome problems and > 98% of open neural tube defects can be detected. This procedure is generally performed after the 15th week of pregnancy.  The main risks to this procedure include complications leading to miscarriage in less than 1 in 200 cases (0.5%).  We also reviewed the availability of cell free fetal DNA testing from maternal blood to determine whether or not the baby may have either Down syndrome, trisomy 6, or trisomy 68.  This test utilizes a maternal blood sample and DNA sequencing technology to isolate circulating cell free fetal DNA from maternal plasma.  The fetal DNA can then be analyzed for DNA sequences that are derived from the three most common chromosomes involved in aneuploidy, chromosomes 13, 18, and 21.  If the overall amount of DNA is greater than the expected level for any of these chromosomes, aneuploidy is suspected.  While we do not consider it a replacement for invasive testing and karyotype analysis, a negative result from this testing would be reassuring, though not a guarantee of a normal chromosome complement for the baby.  An abnormal result is certainly suggestive of an abnormal chromosome complement, though we would still recommend amniocentesis to confirm any findings from this testing.  Cystic Fibrosis and Spinal Muscular Atrophy (SMA) screening were also discussed with the patient. Both conditions are recessive, which means that both parents must be  carriers in order to have a child with the disease.  Cystic fibrosis (CF) is one of the most common genetic conditions in persons of Caucasian ancestry.  This condition occurs in approximately 1 in 2,500 Caucasian persons and results in thickened secretions in the lungs, digestive,  and reproductive systems.  For a baby to be at risk for having CF, both of the parents must be carriers for this condition.  Approximately 1 in 24 Caucasian persons is a carrier for CF.  Current carrier testing looks for the most common mutations in the gene for CF and can detect approximately 90% of carriers in the Caucasian population.  This means that the carrier screening can greatly reduce, but cannot eliminate, the chance for an individual to have a child with CF.  If an individual is found to be a carrier for CF, then carrier testing would be available for the partner. As part of [place] newborn screening profile, all babies born in the state of [place] will have a two-tier screening process.  Specimens are first tested to determine the concentration of immunoreactive trypsinogen (IRT).  The top 5% of specimens with the highest IRT values then undergo DNA testing using a panel of over 40 common CF mutations. SMA is a neurodegenerative disorder that leads to atrophy of skeletal muscle and overall weakness.  This condition is also more prevalent in the Caucasian population, with 1 in 40-1 in 60 persons being a carrier and 1 in 6,000-1 in 10,000 children being affected.  There are multiple forms of the disease, with some causing death in infancy to other forms with survival into adulthood.  The genetics of SMA is complex, but carrier screening can detect up to 95% of carriers in the Caucasian population.  Similar to CF, a negative result can greatly reduce, but cannot eliminate, the chance to have a child with SMA.  We obtained a detailed family history and pregnancy history.  The patient has a 80 year old daughter who lives in [place] and is in good health. She also had an early miscarriage with a prior partner. The father of the baby, Regan Lemming, is 48 years old and has a 37 year old daughter who is in good health.   Advanced paternal age is known to be associated with an increased chance for  several fetal health conditions.  Therefore, we also reviewed the issues surrounding advanced paternal age.  There is known to be an increase in single gene abnormalities in the children of men who are over age 35-50, though the specific age varies in different studies.  This chance is estimated to be no greater than 2%. These mutations may result in birth defects or genetic syndromes which may show differences on ultrasound in the second trimester.  Therefore, we recommend a detailed anatomy ultrasound at approximately [redacted] weeks gestation. A normal ultrasound cannot rule out these disorders. Advanced paternal age has also been associated with other conditions including autism spectrum disorders, schizophrenia and childhood acute lymphoblastic leukemia.  It is important to be aware of these associations, though no clinical testing is available for these conditions at this time.  The remainder of the family history is unremarkable for birth defects, developmental delays, recurrent pregnancy loss or known chromosome abnormalities.  Ms. Coggin stated that this is her third pregnancy, the first with her current partner.  She reported no complications or exposures that would be expected to increase the risk for birth defects.  She is taking prenatal vitamins and  a baby aspirin daily.  After consideration of the options, Ms. Baumbach elected to proceed with MaterniT21 PLUS with SCA, CF and SMA carrier screening.  We also recommend a fetal echocardiogram after [redacted] weeks gestation due to maternal age greater than 59 years.  An ultrasound was performed at the time of the visit.  The gestational age was consistent with 36 weeks.   No markers of aneuploidy were noted, but it is important to remember that a normal ultrasound does not exclude the possibility of birth defect or chromosome condition.  Please refer to the ultrasound report for details of that study.  Ms. Gronewold was encouraged to call with  questions or concerns.  We can be contacted at [phone].   Tests Ordered:  MaterniT21 PLUS with SCA, CF carrier screening, SMA carrier screening   Wilburt Finlay, MS, CGC  Margarine Grosshans, [place] A, MD

## 2017-11-18 LAB — MATERNIT21 PLUS CORE+SCA
CHROMOSOME 13: NEGATIVE
CHROMOSOME 18: NEGATIVE
CHROMOSOME 21: NEGATIVE
Y Chromosome: NOT DETECTED

## 2017-11-18 LAB — CYSTIC FIBROSIS GENE TEST

## 2017-11-21 LAB — SMN1 COPY NUMBER ANALYSIS (SMA CARRIER SCREENING)

## 2017-11-24 ENCOUNTER — Telehealth: Payer: Self-pay | Admitting: Obstetrics and Gynecology

## 2017-11-24 NOTE — Telephone Encounter (Signed)
We have been unable to reach Julie Fry with the results of her MaterniT21 testing and carrier screening, but left a message for her to contact our office to review these results.  The normal results are as follows:  The results of her recent MaterniT21 testing yielded NEGATIVE results.  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  The sensitivity for trisomy 45, trisomy 15 and trisomy 16 using this testing are reported as 99.1%, 99.9% and 91.7% respectively.  Thus, while the results of this testing are highly accurate, they are not considered diagnostic at this time.  Should more definitive information be desired, the patient may still consider amniocentesis.   As requested to know by the patient, sex chromosome analysis was included for this sample.  Results was consistent with a female fetus (no Y chromosome DNA was detected). This is predicted with >97% accuracy.  A maternal serum AFP only should be considered if screening for neural tube defects is desired.  The screening test results for Cystic fibrosis (CF) are also available.  CF is a genetic condition that occurs most often in Caucasian persons.  It primarily affects the lungs, digestive, and reproductive systems.  For someone to be at risk for having CF, both of their parents must be carriers for CF.  The testing can detect many persons who are carriers for CF and therefore determine if the pregnancy is at an increased risk for this condition.  The blood test results were negative when examined for the 32 most common mutations (or changes) in the gene for CF.  This means that she does not carry any of the most common changes in this gene.  Testing for these 32 mutations detects approximately 55% of carriers who are Asian.  Therefore, the chance that she is a carrier based on this negative result has been reduced from 1 in 94 to approximately 1 in 208.  Because this testing cannot detect all  changes that may cause CF, we cannot eliminate the chance that this individual is a carrier completely.  Lastly, the results of the SMA carrier screening are also available.  SMA is also a recessive genetic condition with variable age of onset and severity caused by mutations in the SMN1 gene.  This carrier testing assesses the number of copies of this gene.  Persons with one copy of the SMN1 gene are carriers, and those with no copies are affected with the condition.  Individuals with two or more copies have a reduced chance to be a carrier.  Not all mutations can be detected with this testing, though it can detect 93.3% of carriers in the Asian population.  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  Again, this testing cannot eliminate the chance to have a child with SMA, but dramatically reduces the chance.    We encouraged the patient to call with any questions or concerns as they arise.  We may be reached at [phone].  Wilburt Finlay, MS, CGC

## 2017-12-08 NOTE — Addendum Note (Signed)
Encounter addended by: Eather Chaires, [place], MD on: 12/08/2017 1:16 PM  Actions taken: Sign clinical note

## 2018-01-01 ENCOUNTER — Encounter: Payer: BLUE CROSS/BLUE SHIELD | Admitting: Nurse Practitioner

## 2018-01-01 DIAGNOSIS — O09529 Supervision of elderly multigravida, unspecified trimester: Secondary | ICD-10-CM | POA: Diagnosis not present

## 2018-01-02 ENCOUNTER — Encounter: Payer: BLUE CROSS/BLUE SHIELD | Admitting: Nurse Practitioner

## 2018-01-16 ENCOUNTER — Encounter: Payer: BLUE CROSS/BLUE SHIELD | Admitting: Family Medicine

## 2018-01-20 ENCOUNTER — Encounter: Payer: BLUE CROSS/BLUE SHIELD | Admitting: Nurse Practitioner

## 2018-01-26 DIAGNOSIS — O09893 Supervision of other high risk pregnancies, third trimester: Secondary | ICD-10-CM | POA: Diagnosis not present

## 2018-01-29 DIAGNOSIS — R7309 Other abnormal glucose: Secondary | ICD-10-CM | POA: Diagnosis not present

## 2018-03-11 DIAGNOSIS — Z3483 Encounter for supervision of other normal pregnancy, third trimester: Secondary | ICD-10-CM | POA: Diagnosis not present

## 2018-03-18 DIAGNOSIS — O0993 Supervision of high risk pregnancy, unspecified, third trimester: Secondary | ICD-10-CM | POA: Diagnosis not present

## 2018-04-10 ENCOUNTER — Other Ambulatory Visit: Payer: Self-pay | Admitting: Certified Nurse Midwife

## 2018-04-12 ENCOUNTER — Other Ambulatory Visit: Payer: Self-pay

## 2018-04-12 ENCOUNTER — Inpatient Hospital Stay
Admission: EM | Admit: 2018-04-12 | Discharge: 2018-04-14 | DRG: 807 | Disposition: A | Discharge status: Home / Self Care | Payer: BLUE CROSS/BLUE SHIELD | Attending: Obstetrics and Gynecology | Admitting: Obstetrics and Gynecology

## 2018-04-12 DIAGNOSIS — D259 Leiomyoma of uterus, unspecified: Secondary | ICD-10-CM | POA: Diagnosis not present

## 2018-04-12 DIAGNOSIS — Z3A40 40 weeks gestation of pregnancy: Secondary | ICD-10-CM | POA: Diagnosis not present

## 2018-04-12 DIAGNOSIS — Z23 Encounter for immunization: Secondary | ICD-10-CM | POA: Diagnosis not present

## 2018-04-12 DIAGNOSIS — O4292 Full-term premature rupture of membranes, unspecified as to length of time between rupture and onset of labor: Principal | ICD-10-CM | POA: Diagnosis present

## 2018-04-12 DIAGNOSIS — O99824 Streptococcus B carrier state complicating childbirth: Secondary | ICD-10-CM | POA: Diagnosis not present

## 2018-04-12 DIAGNOSIS — O3413 Maternal care for benign tumor of corpus uteri, third trimester: Secondary | ICD-10-CM | POA: Diagnosis present

## 2018-04-12 DIAGNOSIS — O480 Post-term pregnancy: Secondary | ICD-10-CM | POA: Diagnosis not present

## 2018-04-12 DIAGNOSIS — Z3490 Encounter for supervision of normal pregnancy, unspecified, unspecified trimester: Secondary | ICD-10-CM | POA: Diagnosis present

## 2018-04-12 LAB — CBC
HCT: 36 % (ref 35.0–47.0)
Hemoglobin: 12.5 g/dL (ref 12.0–16.0)
MCH: 29.9 pg (ref 26.0–34.0)
MCHC: 34.8 g/dL (ref 32.0–36.0)
MCV: 85.9 fL (ref 80.0–100.0)
Platelets: 223 10*3/uL (ref 150–440)
RBC: 4.19 MIL/uL (ref 3.80–5.20)
RDW: 14.5 % (ref 11.5–14.5)
WBC: 10.2 10*3/uL (ref 3.6–11.0)

## 2018-04-12 LAB — TYPE AND SCREEN
ABO/RH(D): O POS
ANTIBODY SCREEN: NEGATIVE

## 2018-04-12 MED ORDER — DIBUCAINE 1 % RE OINT: 1.0000 "application " | TOPICAL_OINTMENT | RECTAL | Status: DC | PRN

## 2018-04-12 MED ORDER — ACETAMINOPHEN 325 MG PO TABS
650.0000 mg | ORAL_TABLET | ORAL | Status: DC | PRN
  Administered 2018-04-12 – 2018-04-14 (×5): 650 mg via ORAL
  Filled 2018-04-12 (×4): qty 2

## 2018-04-12 MED ORDER — OXYTOCIN 40 UNITS IN LACTATED RINGERS INFUSION - SIMPLE MED
2.5000 [IU]/h | INTRAVENOUS | Status: DC
  Administered 2018-04-12: 2.5 [IU]/h via INTRAVENOUS
  Filled 2018-04-12: qty 1000

## 2018-04-12 MED ORDER — ONDANSETRON HCL 4 MG/2ML IJ SOLN: 4.0000 mg | Freq: Four times a day (QID) | INTRAMUSCULAR | Status: DC | PRN

## 2018-04-12 MED ORDER — SODIUM CHLORIDE 0.9 % IV SOLN
1.0000 g | INTRAVENOUS | Status: DC
  Administered 2018-04-12: 1 g via INTRAVENOUS
  Filled 2018-04-12 (×6): qty 1000

## 2018-04-12 MED ORDER — BENZOCAINE-MENTHOL 20-0.5 % EX AERO
1.0000 "application " | INHALATION_SPRAY | CUTANEOUS | Status: DC | PRN
  Administered 2018-04-13: 1 via TOPICAL
  Filled 2018-04-12: qty 56

## 2018-04-12 MED ORDER — SENNOSIDES-DOCUSATE SODIUM 8.6-50 MG PO TABS
2.0000 | ORAL_TABLET | ORAL | Status: DC
  Filled 2018-04-12 (×2): qty 2

## 2018-04-12 MED ORDER — LIDOCAINE HCL (PF) 1 % IJ SOLN
INTRAMUSCULAR | Status: AC
  Administered 2018-04-12: 30 mL via SUBCUTANEOUS
  Filled 2018-04-12: qty 30

## 2018-04-12 MED ORDER — LACTATED RINGERS IV SOLN: 500.0000 mL | INTRAVENOUS | Status: DC | PRN

## 2018-04-12 MED ORDER — PRENATAL MULTIVITAMIN CH: 1.0000 | ORAL_TABLET | Freq: Every day | ORAL | Status: DC

## 2018-04-12 MED ORDER — WITCH HAZEL-GLYCERIN EX PADS: 1.0000 "application " | MEDICATED_PAD | CUTANEOUS | Status: DC | PRN

## 2018-04-12 MED ORDER — SODIUM CHLORIDE 0.9 % IV SOLN
2.0000 g | Freq: Four times a day (QID) | INTRAVENOUS | Status: DC
  Administered 2018-04-12: 2 g via INTRAVENOUS
  Filled 2018-04-12: qty 2000

## 2018-04-12 MED ORDER — MISOPROSTOL 200 MCG PO TABS
ORAL_TABLET | ORAL | Status: AC
  Filled 2018-04-12: qty 4

## 2018-04-12 MED ORDER — IBUPROFEN 600 MG PO TABS
600.0000 mg | ORAL_TABLET | Freq: Four times a day (QID) | ORAL | Status: DC
  Administered 2018-04-12 – 2018-04-14 (×4): 600 mg via ORAL
  Filled 2018-04-12 (×4): qty 1

## 2018-04-12 MED ORDER — LACTATED RINGERS IV SOLN
INTRAVENOUS | Status: DC
  Administered 2018-04-12: 1000 mL via INTRAVENOUS

## 2018-04-12 MED ORDER — OXYTOCIN 40 UNITS IN LACTATED RINGERS INFUSION - SIMPLE MED
1.0000 m[IU]/min | INTRAVENOUS | Status: DC
  Administered 2018-04-12: 1 m[IU]/min via INTRAVENOUS

## 2018-04-12 MED ORDER — PENICILLIN G POTASSIUM 5000000 UNITS IJ SOLR: 5.0000 10*6.[IU] | Freq: Once | INTRAMUSCULAR | Status: DC

## 2018-04-12 MED ORDER — ACETAMINOPHEN 325 MG PO TABS
650.0000 mg | ORAL_TABLET | ORAL | Status: DC | PRN
  Filled 2018-04-12: qty 2

## 2018-04-12 MED ORDER — DIPHENHYDRAMINE HCL 25 MG PO CAPS: 25.0000 mg | ORAL_CAPSULE | Freq: Four times a day (QID) | ORAL | Status: DC | PRN

## 2018-04-12 MED ORDER — LIDOCAINE HCL (PF) 1 % IJ SOLN
30.0000 mL | INTRAMUSCULAR | Status: DC | PRN
  Administered 2018-04-12: 30 mL via SUBCUTANEOUS

## 2018-04-12 MED ORDER — ONDANSETRON HCL 4 MG/2ML IJ SOLN: 4.0000 mg | INTRAMUSCULAR | Status: DC | PRN

## 2018-04-12 MED ORDER — OXYTOCIN BOLUS FROM INFUSION
500.0000 mL | Freq: Once | INTRAVENOUS | Status: AC
  Administered 2018-04-12: 500 mL via INTRAVENOUS

## 2018-04-12 MED ORDER — ZOLPIDEM TARTRATE 5 MG PO TABS: 5.0000 mg | ORAL_TABLET | Freq: Every evening | ORAL | Status: DC | PRN

## 2018-04-12 MED ORDER — FENTANYL CITRATE (PF) 100 MCG/2ML IJ SOLN
INTRAMUSCULAR | Status: AC
  Administered 2018-04-12: 50 ug via INTRAVENOUS
  Filled 2018-04-12: qty 2

## 2018-04-12 MED ORDER — TETANUS-DIPHTH-ACELL PERTUSSIS 5-2.5-18.5 LF-MCG/0.5 IM SUSP
0.5000 mL | Freq: Once | INTRAMUSCULAR | Status: DC
  Filled 2018-04-12: qty 0.5

## 2018-04-12 MED ORDER — PENICILLIN G POT IN DEXTROSE 60000 UNIT/ML IV SOLN: 3.0000 10*6.[IU] | INTRAVENOUS | Status: DC

## 2018-04-12 MED ORDER — BUTORPHANOL TARTRATE 1 MG/ML IJ SOLN: 1.0000 mg | INTRAMUSCULAR | Status: DC | PRN

## 2018-04-12 MED ORDER — TERBUTALINE SULFATE 1 MG/ML IJ SOLN: 0.2500 mg | Freq: Once | INTRAMUSCULAR | Status: DC | PRN

## 2018-04-12 MED ORDER — SIMETHICONE 80 MG PO CHEW: 80.0000 mg | CHEWABLE_TABLET | ORAL | Status: DC | PRN

## 2018-04-12 MED ORDER — AMMONIA AROMATIC IN INHA
RESPIRATORY_TRACT | Status: AC
  Filled 2018-04-12: qty 10

## 2018-04-12 MED ORDER — OXYTOCIN 10 UNIT/ML IJ SOLN
INTRAMUSCULAR | Status: AC
  Filled 2018-04-12: qty 2

## 2018-04-12 MED ORDER — ONDANSETRON HCL 4 MG PO TABS: 4.0000 mg | ORAL_TABLET | ORAL | Status: DC | PRN

## 2018-04-12 MED ORDER — SOD CITRATE-CITRIC ACID 500-334 MG/5ML PO SOLN: 30.0000 mL | ORAL | Status: DC | PRN

## 2018-04-12 MED ORDER — FENTANYL CITRATE (PF) 100 MCG/2ML IJ SOLN
50.0000 ug | INTRAMUSCULAR | Status: DC | PRN
  Administered 2018-04-12: 50 ug via INTRAVENOUS

## 2018-04-12 MED ORDER — COCONUT OIL OIL: 1.0000 "application " | TOPICAL_OIL | Status: DC | PRN

## 2018-04-12 NOTE — Progress Notes (Signed)
Labor Progress Note  Julie Fry is a 42 y.o. G3P0 at [redacted]w[redacted]d by [place] at 16wks, admitted in early labor with SROM.   Subjective: feeling more pain with UCs, declines IVPM or epidural at this time.   Objective: BP (!) 113/59 (BP Location: Left Arm)   Temp 98.3 F (36.8 C) (Oral)   Resp 16   Ht 4\' 11"  (1.499 m)   Wt 141 lb (64 kg)   LMP 11/29/2016   BMI 28.48 kg/m  Notable VS details: reviewed.   Fetal Assessment: FHT:  FHR: 135 bpm, variability: moderate,  accelerations:  Present,  decelerations:  Absent Category/reactivity:  Category I UC:   irregular, every 5-10 minutes SVE:   5-6/90/-1 Membrane status: SROM at 0600 Amniotic color: Clear fluid  Labs: Lab Results  Component Value Date   WBC 10.2 04/12/2018   HGB 12.5 04/12/2018   HCT 36.0 04/12/2018   MCV 85.9 04/12/2018   PLT 223 04/12/2018    Assessment / Plan: G4 P1021 at 40+5wks with SROM in early labor.   Labor: some progression since admission, Forebag SROM during exam. Will consider Pitocin  Preeclampsia:  no elevated BPs. Fetal Wellbeing:  Category I Pain Control:  Labor support without medications I/D:  GBS Pos, Ampicillin 1st dose given Anticipated MOD:  NSVD  McVey, REBECCA A, CNM 04/12/2018, 3:22 PM

## 2018-04-12 NOTE — H&P (Signed)
OB History & Physical   History of Present Illness:  Chief Complaint:  Leaking fluid, bloody mucus and contractions this am.   HPI:  Julie Fry is a 42 y.o. G3P0 female at [redacted]w[redacted]d dated by US done at 16+0wks.  She presents to L&D for ctx, currently every 5 minutes; LOF at 0600 noted with bloody mucus, reports Active FM.   Pregnancy Issues: 1. AMA with hx SAB x 2 2. Fibroids, anterior 3. Abnormal 1h GTT with normal 3h GTT   Maternal Medical History:   Past Medical History:  Diagnosis Date  . Medical history non-contributory     Past Surgical History:  Procedure Laterality Date  . DILATION AND EVACUATION N/A 08/07/2015   Procedure: DILATATION AND EVACUATION;  Surgeon: Boykin Nearing, MD;  Location: [facility] ORS;  Service: Gynecology;  Laterality: N/A;    No Known Allergies  Prior to Admission medications   Medication Sig Start Date End Date Taking? Authorizing Provider  Prenatal Vit-Fe Fumarate-FA (PRENATAL VITAMIN) 27-0.8 MG TABS Take 1 tablet by mouth daily. 01/14/17  Yes Lada, Satira Anis, MD     Prenatal care site: [place]    Social History: She  reports that she has never smoked. She has never used smokeless tobacco. She reports that she does not drink alcohol or use drugs.  Family History: family history includes Cancer in her father.   Review of Systems: A full review of systems was performed and negative except as noted in the HPI.     Physical Exam:  Vital Signs: LMP 11/29/2016  General: no acute distress.  HEENT: normocephalic, atraumatic Heart: regular rate & rhythm.  No murmurs/rubs/gallops Lungs: clear to auscultation bilaterally, normal respiratory effort Abdomen: soft, gravid, non-tender;  EFW: 8lbs Pelvic: SSE- scant clear fluid, bloody show noted. Fern Pos.   External: Normal external female genitalia  Cervix: Dilation: 4.5 / Effacement (%): 80 / Station: -2    Extremities: non-tender, symmetric, no edema bilaterally.  DTRs:  2+  Neurologic: Alert & oriented x 3.    No results found for this or any previous visit (from the past 24 hour(s)).  Pertinent Results:  Prenatal Labs: Blood type/Rh  O Pos  Antibody screen neg  Rubella Immune  Varicella Immune  RPR NR  HBsAg Neg  HIV NR  GC neg  Chlamydia neg  Genetic screening negative  1 hour GTT  172  3 hour GTT  [id number]  GBS  POS   FHT: 140bpm, mod variability, + accels, no decels TOCO: q4-25min, palp mod SVE:  Dilation: 4.5 / Effacement (%): 80 / Station: -2    Cephalic by leopolds  No results found.  Assessment:  Julie Fry is a 42 y.o. G3P0 female at [redacted]w[redacted]d with PROM, early labor.   Plan:  1. Admit to Labor & Delivery; consents reviewed and obtained with assistance of language line interpreters.   2. Fetal Well being  - Fetal Tracing: Cat I  - Group B Streptococcus ppx indicated: POS, Ampicillin 2gm IVPB ordered - Presentation: cephalic confirmed by exam and leopolds   3. Routine OB: - Prenatal labs reviewed, as above - Rh O Pos - CBC, T&S, RPR on admit - Clear fluids, IVF  4. Monitoring of Labor -  Contractions: external toco in place -  Pelvis proven  -  Plan for induction with AROM of forebag if needed -  Plan for continuous fetal monitoring  -  Maternal pain control as desired; requesting IVPM if needed - Anticipate vaginal  delivery  McVey, [place] A, CNM 04/12/18 12:10 PM

## 2018-04-12 NOTE — OB Triage Note (Signed)
Patient here for related to leaking of mucous and small amount of blood this morning at 6 am and 10 am  This is being relayed by her husband who speaks english laios interpreter not available at this time. She does complain of some cramping lower but cannot tell me about the pattern.

## 2018-04-12 NOTE — Discharge Summary (Addendum)
Obstetrical Discharge Summary  Patient Name: Julie Fry DOB: Dec 01, 1975 MRN: [id number]  Date of Admission: 04/12/2018 Date of Delivery: 04/12/18 Delivered by: Sheppard Penton CNM Date of Discharge: 04/14/2018  Primary OB: [facility]  [id number]'O last menstrual period was 11/29/2016. EDC Estimated Date of Delivery: 04/07/18 Gestational Age at Delivery: [redacted]w[redacted]d   Antepartum complications:  1. AMA with hx SAB x 2 2. Fibroids, anterior 3. Abnormal 1h GTT with normal 3h GTT   Admitting Diagnosis: PROM, labor Secondary Diagnosis: SVD with 1st deg lac Patient Active Problem List   Diagnosis Date Noted  . Encounter for induction of labor 04/12/2018  . Advanced maternal age in multigravida, second trimester   . Preventative health care 01/14/2017    Augmentation: Pitocin Complications: None Intrapartum complications/course: Presented with ROM, 4cm cervix, Pitocin and forebag, IVPM x1,  progressed spontaneously to C/C/+1, pushed well to SVD of viable female infant.  Date of Delivery: 04/12/18 Delivered By: Sheppard Penton CNM Delivery Type: spontaneous vaginal delivery Anesthesia: local for repair Placenta: spontaneous Laceration: 1st deg Episiotomy: none Newborn Data: Live born female  Birth Weight: 7 lb 9.3 oz (3440 g) APGAR: 7,   Newborn Delivery   Birth date/time:  04/12/2018 17:18:00 Delivery type:  Vaginal, Spontaneous       Postpartum Procedures: none  Post partum course:  Patient had an uncomplicated postpartum course. By time of discharge on PPD#2, her pain was controlled on oral pain medications; she had appropriate lochia and was ambulating, voiding without difficulty and tolerating regular diet.  She was deemed stable for discharge to home.     Discharge Physical Exam: 04/14/2018  BP 114/75 (BP Location: Right Arm)   Pulse 75   Temp 98 F (36.7 C) (Oral)   Resp 18   Ht 4\' 11"  (1.499 m)   Wt 64 kg (141 lb)   LMP 11/29/2016   SpO2 100%    Breastfeeding? Unknown   BMI 28.48 kg/m   General: NAD CV: RRR Pulm: CTABL, nl effort ABD: s/nd/nt, fundus firm and below the umbilicus Lochia: moderate DVT Evaluation: LE non-ttp, no evidence of DVT on exam.  Hemoglobin  Date Value Ref Range Status  04/13/2018 11.5 (L) 12.0 - 16.0 g/dL Final   HCT  Date Value Ref Range Status  04/13/2018 33.9 (L) 35.0 - 47.0 % Final     Disposition: stable, discharge to home. Baby Feeding: breastmilk & formula Baby Disposition: home with mom  Rh Immune globulin given: n/a Rubella vaccine given: n/a Varicella vaccine given: n/a Tdap vaccine given in PP setting: declined AP Flu vaccine given: n/a  Contraception: elects for none   Prenatal Labs:  Blood type/Rh O Pos  Antibody screen neg  Rubella Immune  Varicella Immune  RPR NR  HBsAg Neg  HIV NR  GC neg  Chlamydia neg  Genetic screening negative  1 hour GTT  172  3 hour GTT  [phone]  GBS  POS    Plan:  Tine Maciejewski was discharged to home in good condition. Follow-up appointment with delivering provider in 6 weeks.  Discharge Medications: Allergies as of 04/14/2018   No Known Allergies     Medication List    TAKE these medications   acetaminophen 325 MG tablet Commonly known as:  TYLENOL Take 2 tablets (650 mg total) by mouth every 4 (four) hours as needed for mild pain or moderate pain.   ibuprofen 600 MG tablet Commonly known as:  ADVIL,MOTRIN Take 1 tablet (600 mg total) by mouth every 6 (  six) hours as needed for mild pain, moderate pain or cramping.   Prenatal Vitamin 27-0.8 MG Tabs Take 1 tablet by mouth daily.       Follow-up Information    McVey, Murray Hodgkins, CNM. Schedule an appointment as soon as possible for a visit in 6 week(s).   Specialty:  Obstetrics and Gynecology Contact information: [address] [place] [address] [phone]           Signed: Lisette Grinder, CNM 04/14/2018 8:26 AM

## 2018-04-13 LAB — CBC
HCT: 33.9 % — ABNORMAL LOW (ref 35.0–47.0)
Hemoglobin: 11.5 g/dL — ABNORMAL LOW (ref 12.0–16.0)
MCH: 29.4 pg (ref 26.0–34.0)
MCHC: 33.9 g/dL (ref 32.0–36.0)
MCV: 86.5 fL (ref 80.0–100.0)
PLATELETS: 183 10*3/uL (ref 150–440)
RBC: 3.93 MIL/uL (ref 3.80–5.20)
RDW: 14.6 % — AB (ref 11.5–14.5)
WBC: 16.6 10*3/uL — AB (ref 3.6–11.0)

## 2018-04-13 NOTE — Lactation Note (Signed)
This note was copied from a baby's chart. Lactation Consultation Note  Patient Name: Julie Fry YVOPF'Y Date: 04/13/2018 Reason for consult: Initial assessment   Maternal Data Formula Feeding for Exclusion: No Has patient been taught Hand Expression?: Yes Does the patient have breastfeeding experience prior to this delivery?: Yes Breastfed first child x 25yr , that child is now 42 yr old Feeding Feeding Type: Breast Fed Length of feed: (left breast)  LATCH Score Latch: Grasps breast easily, tongue down, lips flanged, rhythmical sucking.  Audible Swallowing: Spontaneous and intermittent  Type of Nipple: Everted at rest and after stimulation  Comfort (Breast/Nipple): Soft / non-tender  Hold (Positioning): Assistance needed to correctly position infant at breast and maintain latch.  LATCH Score: 9  Interventions Interventions: Assisted with latch;Skin to skin;Breast compression;Adjust position  Lactation Tools Discussed/Used WIC Program: No   Consult Status Consult Status: Follow-up Date: 04/14/18 Follow-up type: In-patient    Ferol Luz 04/13/2018, 3:45 PM

## 2018-04-13 NOTE — Plan of Care (Signed)
Vital signs stable; fundus firm; small amount rubra lochia; voiding; tolerating regular diet well; good po fluid intake; saline lock intact; site clear; breastfeeding with good technique observed; husband at bedside and attentive; good maternal-infant bonding observed.

## 2018-04-14 LAB — RPR: RPR: NONREACTIVE

## 2018-04-14 MED ORDER — IBUPROFEN 600 MG PO TABS: 600.0000 mg | ORAL_TABLET | Freq: Four times a day (QID) | ORAL | 0 refills | Status: DC | PRN

## 2018-04-14 MED ORDER — ACETAMINOPHEN 325 MG PO TABS: 650.0000 mg | ORAL_TABLET | ORAL | 0 refills | Status: DC | PRN

## 2018-04-14 NOTE — Progress Notes (Signed)
Dc inst reviewed with pt and FOB.  Verb u/o of f/u care.  Discharged to home in wc with NB

## 2018-04-14 NOTE — Discharge Instructions (Signed)
After Your Delivery Discharge Instructions   Postpartum: Care Instructions  After childbirth (postpartum period), your body goes through many changes. Some of these changes happen over several weeks. In the hours after delivery, your body will begin to recover from childbirth while it prepares to breastfeed your newborn. You may feel emotional during this time. Your hormones can shift your mood without warning for no clear reason.  In the first couple of weeks after childbirth, many women have emotions that change from happy to sad. You may find it hard to sleep. You may cry a lot. This is called the "baby blues." These overwhelming emotions often go away within a couple of days or weeks. But it's important to discuss your feelings with your doctor.  You should call your care provider if you have unrelieved feelings of:  Inability to cope  Sadness  Anxiety  Lack of interest in baby  Insomnia  Crying  It is easy to get too tired and overwhelmed during the first weeks after childbirth. Don't try to do too much. Get rest whenever you can, accept help from others, and eat well and drink plenty of fluids.  About 4 to 6 weeks after your baby's birth, you will have a follow-up visit with your care provider. This visit is your time to talk to your provider about anything you are concerned or curious about.  Follow-up care is a key part of your treatment and safety. Be sure to make and go to all appointments, and call your doctor if you are having problems. It's also a good idea to know your test results and keep a list of the medicines you take.  How can you care for yourself at home?  Sleep or rest when your baby sleeps.  Get help with household chores from family or friends, if you can. Do not try to do it all yourself.  If you have hemorrhoids or swelling or pain around the opening of your vagina, try using cold and heat. You can put ice or a cold pack on the area for 10 to 20 minutes at  a time. Put a thin cloth between the ice and your skin. Also try sitting in a few inches of warm water (sitz bath) 3 times a day and after bowel movements.  Take pain medicines exactly as directed.  If the provider gave you a prescription medicine for pain, take it as prescribed.  If you do not have a prescription and need something over the counter, you can take:  Ibuprofen (Motrin, Advil) up to '600mg'$  every 6 hours as needed for pain  Acetaminophen (Tylenol) up to '650mg'$  every 4 hours as needed for pain  Some people find it helpful to alternate between these two medications.   No driving for 1-2 weeks or while taking pain medications.   Eat more fiber to avoid constipation. Include foods such as whole-grain breads and cereals, raw vegetables, raw and dried fruits, and beans.  Drink plenty of fluids, enough so that your urine is light yellow or clear like water. If you have kidney, heart, or liver disease and have to limit fluids, talk with your doctor before you increase the amount of fluids you drink.  Do not put anything in the vagina for 6 weeks. This means no sex, no tampons, no douching, and no enemas.  If you have stitches, keep the area clean by pouring or spraying warm water over the area outside your vagina and anus after you use the toilet.  No strenuous activity or heavy lifting for 6 weeks   No tub baths; showers only  Continue prenatal vitamin and iron.  If breastfeeding:  Increase calories and fluids while breastfeeding.  You may have a slight fever when your milk comes in, but it should go away on its own. If it does not, and rises above 101.0 please call the doctor.  For breastfeeding concerns, the lactation consultant can be reached at [phone].  For concerns about your baby, please call your pediatrician.   Keep a list of questions to bring to your postpartum visit. Your questions might be about:  Changes in your breasts, such as lumps or  soreness.  When to expect your menstrual period to start again.  What form of birth control is best for you.  Weight you have put on during the pregnancy.  Exercise options.  What foods and drinks are best for you, especially if you are breastfeeding.  Problems you might be having with breastfeeding.  When you can have sex. Some women may want to talk about lubricants for the vagina.  Any feelings of sadness or restlessness that you are having.   When should you call for help?  Call 911 anytime you think you may need emergency care. For example, call if:  You have thoughts of harming yourself, your baby, or another person.  You passed out (lost consciousness).  Call the office at [phone] or seek immediate medical care if:  If you have heavy bleeding such that you are soaking 1 pad in an hour for 2 hours  You are dizzy or lightheaded, or you feel like you may faint.  You have a fever; a temperature of 101.0 F or greater  Chills  Difficulty urinating  Headache unrelieved by "pain meds"   Visual changes  Pain in the right side of your belly near your ribs  Breasts reddened, hard, hot to the touch or any other breast concerns  Nipple discharge which is foul-smelling or contains pus   New pain unrelieved with recommended over-the-counter dosages  Difficulty breathing with or without chest pain   New leg pain, swelling, or redness, especially if it is only on one leg  Any other concerns  Watch closely for changes in your health, and be sure to contact your provider if:  You have new or worse vaginal discharge.  You feel sad or depressed.  You are having problems with your breasts or breastfeeding.    

## 2018-04-16 DIAGNOSIS — R1030 Lower abdominal pain, unspecified: Secondary | ICD-10-CM | POA: Diagnosis not present

## 2018-04-29 ENCOUNTER — Ambulatory Visit: Payer: BLUE CROSS/BLUE SHIELD | Attending: Obstetrics and Gynecology | Admitting: Physical Therapy

## 2018-04-29 ENCOUNTER — Encounter: Payer: Self-pay | Admitting: Physical Therapy

## 2018-04-29 ENCOUNTER — Other Ambulatory Visit: Payer: Self-pay

## 2018-04-29 DIAGNOSIS — M533 Sacrococcygeal disorders, not elsewhere classified: Secondary | ICD-10-CM | POA: Diagnosis not present

## 2018-04-29 DIAGNOSIS — R2689 Other abnormalities of gait and mobility: Secondary | ICD-10-CM | POA: Diagnosis not present

## 2018-04-29 DIAGNOSIS — M6281 Muscle weakness (generalized): Secondary | ICD-10-CM | POA: Diagnosis not present

## 2018-04-29 NOTE — Patient Instructions (Signed)
Adductor squeeze with exhale  with pillow between knees   10 x 3 sets  In morning , afternoon, night   Laying on back or seated with feet on the ground     ________  Minisquat: at the roller walker  Scoot buttocks back slight, hinge like you are looking at your reflection on a pond  Knees behind toes,  Inhale to "smell flowers"  Exhale on the rise "like rocket"  Do not lock knees, have more weight across ballmounds of feet, toes relaxed   10 reps every hour   _________  Rolling in bed ,  Scoot into car seat   move legs with exhale, keeping them close together, move incrementally, use hands on surfaces for support     ________  Use a shower bench in the bathtub  Have assistance into the bath

## 2018-04-30 NOTE — Therapy (Addendum)
[place] MAIN [facility] SERVICES [address] [place], [place], [address] Phone: [phone]   Fax:  [phone]  Physical Therapy Evaluation  Patient Details  Name: Julie Fry MRN: [id number] Date of Birth: [birth year] Referring Provider: Hassan Buckler MD    Encounter Date: 04/29/2018    Past Medical History:  Diagnosis Date  . Medical history non-contributory     Past Surgical History:  Procedure Laterality Date  . DILATION AND EVACUATION N/A 08/07/2015   Procedure: DILATATION AND EVACUATION;  Surgeon: Boykin Nearing, MD;  Location: [facility] ORS;  Service: Gynecology;  Laterality: N/A;    There were no vitals filed for this visit.   Subjective Assessment - 04/30/18 1726    Subjective  Pt deliveried her second baby on 04/12/18 through vaginal delivery and stitches. Baby weight 7lb 9oz.  Pt had LBP ( pt points to SIJ and pubic area )  during the pregnancy and the pain persists at a level 8/10 while changing positions in bed, walking, getting into car/ bath.  Pt is unable to leg up her leg and her boyfriend has to help lift the leg, dressing, and guiding while waking, and sit to stand.  Denied constipation.  Urinary leakage occurs when she tries move her leg while in bed sleeping.      Patient is accompained by:  Family member   boyfriend ( father of child)    Pertinent History  Hx of 1st vaginal delivery without complications. 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                       Objective measurements completed on examination: See above findings.                   PT Long Term Goals - 04/29/18 1835      PT LONG TERM GOAL #1   Title  Pt will decrease her PGQ score from 61% to < 56% in order to return to ADLs     Baseline  61%    Time  12    Period  Weeks    Status  New    Target Date   07/22/18      PT LONG TERM GOAL #2   Title  Pt will demo equal pelvic girdle alignment across 2 visits in order to roll in bed and get out of chair with less pain    Baseline  L iliac crest higher > R     Time  2    Period  Weeks    Status  New    Target Date  05/13/18      PT LONG TERM GOAL #3   Title  Pt will demo no pain and demo restored ROM  with B  hip ext and hip flexion in standing and stepping in order to wean off RW for support when walking    Baseline  pain with hip ext in standing and stepping, minimal hip flexion in gait , requiring BUE support with RW    Time  8    Period  Weeks    Status  New    Target Date  06/24/18      PT LONG TERM GOAL #4   Title  Pt will demo proper technique with deep core strengthening HEP level 1-2 and  pelvic floor strength 3/3/3/3 in order  to promote pelvic girdle stability to lift her baby without pain    Baseline  lumbopelvic pertubations with leg movements    Time  6    Period  Weeks    Status  New    Target Date  06/10/18      PT LONG TERM GOAL #5   Title  Pt will demo proper body mechancis in gravity -loaded tasks for work, bathing, caring for baby, cooking in order to resume PLOF in  household chores     Baseline  poor alignment and technique     Time  6    Period  Weeks    Status  New    Target Date  06/10/18      Additional Long Term Goals   Additional Long Term Goals  Yes      PT LONG TERM GOAL #6   Title  Pt will demo increased gait from 0.5 m/s with RW to >1.0 m/s without RW in order to return to PLOF     Baseline  0.5 m/s with RW    Time  4    Period  Weeks    Status  New    Target Date  05/27/18             Plan - 04/29/18 1011    Clinical Impression Statement  Pt is a 42 yo female who 3 weeks post-partum with her 2nd child. Pt incurred pubic symphysis separation during L & D and currently experiences 9/10 with ADLs.  Pt's clinical presentation includes pelvic obliquities, pubic symphysis malalignment, hip  weakness, gait deviations, and limited bed mobility and t/f due to pain. Following  manual Tx today which facilitated realignment of her pelvic girdle, pt showed less pain and deviations with sit -to-stand, bed mobility, and gait. Pt reported her pain decreased from 9/10 to 6/10 post Tx. Gait speed increased with less BUE weight on RW along with increased stride and swing.  Pt was provided hip strengthening and body mechanics training for HEP. Pt demo'd correctly. Pt benefit from skilled PT   Clinical Presentation  Evolving    Clinical Decision Making  Moderate    Rehab Potential  Good    PT Frequency  1x / week    PT Duration  12 weeks    PT Treatment/Interventions  Moist Heat;Traction;Gait training;Stair training;Functional mobility training;Therapeutic activities;Therapeutic exercise;Balance training;Electrical Stimulation;Manual techniques;Neuromuscular re-education;Patient/family education;Taping;Scar mobilization;Energy conservation    Consulted and Agree with Plan of Care  Patient       Patient will benefit from skilled therapeutic intervention in order to improve the following deficits and impairments:  Improper body mechanics, Pain, Increased muscle spasms, Decreased scar mobility, Decreased mobility, Decreased coordination, Decreased endurance, Decreased range of motion, Increased fascial restricitons, Hypomobility, Decreased strength, Decreased safety awareness, Decreased balance, Postural dysfunction, Abnormal gait  Visit Diagnosis: Sacrococcygeal disorders, not elsewhere classified - Plan: PT plan of care cert/re-cert  Muscle weakness (generalized) - Plan: PT plan of care cert/re-cert  Other abnormalities of gait and mobility - Plan: PT plan of care cert/re-cert     Problem List Patient Active Problem List   Diagnosis Date Noted  . Encounter for induction of labor 04/12/2018  . Advanced maternal age in multigravida, second trimester   . Preventative health care 01/14/2017     Julie Fry ,PT, DPT, E-RYT  05/01/2018, 8:53 AM  [place] MAIN [facility] SERVICES [address] [place], [place], [address] Phone: [phone]   Fax:  [phone]  Name: Madisyn Mawhinney MRN: [id number] Date of Birth: 10-06-1975

## 2018-05-07 ENCOUNTER — Ambulatory Visit: Payer: BLUE CROSS/BLUE SHIELD | Admitting: Physical Therapy

## 2018-05-07 DIAGNOSIS — M533 Sacrococcygeal disorders, not elsewhere classified: Secondary | ICD-10-CM

## 2018-05-07 DIAGNOSIS — R2689 Other abnormalities of gait and mobility: Secondary | ICD-10-CM

## 2018-05-07 DIAGNOSIS — M6281 Muscle weakness (generalized): Secondary | ICD-10-CM | POA: Diagnosis not present

## 2018-05-07 NOTE — Patient Instructions (Addendum)
Prone Heel Press for strengthening sacro-iliac joints  1. Lie on your belly. If you have an arch in your low back or it feels umcomfortable, place a pillow under your low belly/hips to make sure your low back feel comfortable.   2. Place our forehead on top of your palms.      Widen your knees apart for starting position.   3. Inhale, feel belly and low back expand  4. Exhale, feel belly hug in, press heel together and count aloud for 5 sec. Then relax the heel squeezing.  Perform 10 reps of 5 sec holds. 3 sets/ day.    If you feel entire buttock tighten too much or feel low back pain, apply 50% less effort. As you press your heel together, you will feel as if your pubic bone (front of your pelvis) and sacrum (back of your pelvis) gentle move towards each other or your low abdominal muscles hug in more.       ____   childs poses rocking with pillow under belly   Toes tucked, shoulders downa nd back, paws grip , hands shoulder width apart   5 reps   X 3 x day   ____   Bridging   Position:  Elbows bent, knees hip width apart, heels under knees,  Inhale , do nothing, exhale, lift buttocks up without wobblyness of pelvis while pressing arms and shoulders, feet into floor/ bed   10 x 2 sets / x 3 x day   ___ Minisquat: Scoot buttocks back slight, hinge like you are looking at your reflection on a pond  Knees behind toes,  Inhale to "smell flowers"  Exhale on the rise "like rocket"  Do not lock knees, have more weight across ballmounds of feet, toes relaxed   10 reps x 3 x day   _____   Gait training:  walking with shoulders more forward, not leaned back Wider feet under hips

## 2018-05-07 NOTE — Therapy (Signed)
[place] MAIN [facility] SERVICES [address] [place], [place], [address] Phone: [phone]   Fax:  [phone]  Physical Therapy Treatment  Patient Details  Name: Julie Fry MRN: [id number] Date of Birth: May 25, 1976 Referring Provider: Hassan Buckler MD    Encounter Date: 05/07/2018  PT End of Session - 05/07/18 1001    Visit Number  2    Number of Visits  12    Date for PT Re-Evaluation  07/22/18    PT Start Time  0905    PT Stop Time  1000    PT Time Calculation (min)  55 min    Activity Tolerance  Patient tolerated treatment well;No increased pain    Behavior During Therapy  WFL for tasks assessed/performed       Past Medical History:  Diagnosis Date  . Medical history non-contributory     Past Surgical History:  Procedure Laterality Date  . DILATION AND EVACUATION N/A 08/07/2015   Procedure: DILATATION AND EVACUATION;  Surgeon: Boykin Nearing, MD;  Location: [facility] ORS;  Service: Gynecology;  Laterality: N/A;    There were no vitals filed for this visit.  Subjective Assessment - 05/07/18 0915    Subjective  Pt reports 6/10 pain with walking and stepping on R leg. Pt has not needed help from her boyfriend with stepping into the shower, walking.  Pt no longer needs help with ADLs.  Pt still takes Ibuprofen for pain relief.  Without Ibuprofen, pt experiences 9/10 and points to pubic area and B glut and tailbone area.      Patient is accompained by:  Family member   boyfriend ( father of child)    Pertinent History  Hx of 1st vaginal delivery without complications. 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            [facility] PT Assessment - 05/07/18 0952      Observation/Other Assessments   Observations  hypermobility at elbows in quadriped      Strength   Overall Strength Comments  hip ext 3/5 B      Bed Mobility   Rolling Right  --   slowed, less wincing pain                  [facility] Adult PT Treatment/Exercise - 05/07/18 3818      Ambulation/Gait   Assistive device  None    Gait velocity  0.6 m/s after gait training    Gait Comments  initially: narrow BOS, decreased stance on L, posterior trunk lean       Neuro Re-ed    Neuro Re-ed Details   see pt instructions    exhale with leg movements, alignment/ technique with HEP     Manual Therapy   Manual therapy comments  L long axis distraction: PA mob at L lateral sacrum, suprerior mob, rotational mob                   PT Long Term Goals - 04/29/18 1835      PT LONG TERM GOAL #1   Title  Pt will decrease her PGQ score from 61% to < 56% in order to return to ADLs     Baseline  61%    Time  12    Period  Weeks    Status  New    Target Date  07/22/18  PT LONG TERM GOAL #2   Title  Pt will demo equal pelvic girdle alignment across 2 visits in order to roll in bed and get out of chair with less pain    Baseline  L iliac crest higher > R     Time  2    Period  Weeks    Status  New    Target Date  05/13/18      PT LONG TERM GOAL #3   Title  Pt will demo no pain and demo restored ROM  with B  hip ext and hip flexion in standing and stepping in order to wean off RW for support when walking    Baseline  pain with hip ext in standing and stepping, minimal hip flexion in gait , requiring BUE support with RW    Time  8    Period  Weeks    Status  New    Target Date  06/24/18      PT LONG TERM GOAL #4   Title  Pt will demo proper technique with deep core strengthening HEP level 1-2 and  pelvic floor strength 3/3/3/3 in order to promote pelvic girdle stability to lift her baby without pain    Baseline  lumbopelvic pertubations with leg movements    Time  6    Period  Weeks    Status  New    Target Date  06/10/18      PT LONG TERM GOAL #5   Title  Pt will demo proper body mechancis in gravity -loaded tasks for work,  bathing, caring for baby, cooking in order to resume PLOF in  household chores     Baseline  poor alignment and technique     Time  6    Period  Weeks    Status  New    Target Date  06/10/18      Additional Long Term Goals   Additional Long Term Goals  Yes      PT LONG TERM GOAL #6   Title  Pt will demo increased gait from 0.5 m/s with RW to >1.0 m/s without RW in order to return to PLOF     Baseline  0.5 m/s with RW    Time  4    Period  Weeks    Status  New    Target Date  05/27/18            Plan - 05/07/18 1001    Clinical Impression Statement  Pt is improving with less antalgic gait and did not need her RW to walk. Pt still showed slight pelvic obliquities with L upslip of iliac crest but this was corrected with manual Tx. PT tolerated Tx without increased pain. Progressed pt to hip ext strengthening in hooklying position where pubic symphysis was manitained in neutral alignment.  Added SIJ stabilization  exercise. No pain with new HEP .   Withheld quadriped hip extension due to pain and pubic symphysis instability.  Provided gait training to bring COM more anterior. Gait speed increased post Tx. Pt demo'd correctly with report of a decreased in pain from 6/10 to 3/10 post Tx. Pt continues to benefit from skilled PT.     Rehab Potential  Good    PT Frequency  1x / week    PT Duration  12 weeks    PT Treatment/Interventions  Moist Heat;Traction;Gait training;Stair training;Functional mobility training;Therapeutic activities;Therapeutic exercise;Balance training;Electrical Stimulation;Manual techniques;Neuromuscular re-education;Patient/family education;Taping;Scar mobilization;Energy conservation    Consulted  and Agree with Plan of Care  Patient       Patient will benefit from skilled therapeutic intervention in order to improve the following deficits and impairments:  Improper body mechanics, Pain, Increased muscle spasms, Decreased scar mobility, Decreased mobility, Decreased  coordination, Decreased endurance, Decreased range of motion, Increased fascial restricitons, Hypomobility, Decreased strength, Decreased safety awareness, Decreased balance, Postural dysfunction, Abnormal gait  Visit Diagnosis: Sacrococcygeal disorders, not elsewhere classified  Muscle weakness (generalized)  Other abnormalities of gait and mobility     Problem List Patient Active Problem List   Diagnosis Date Noted  . Encounter for induction of labor 04/12/2018  . Advanced maternal age in multigravida, second trimester   . Preventative health care 01/14/2017    [name] ,PT, DPT, E-RYT  05/07/2018, 10:26 AM  [place] MAIN [facility] SERVICES [address] [place], [place], [address] Phone: [phone]   Fax:  [phone]  Name: Julie Fry MRN: [id number] Date of Birth: Feb 20, 1976

## 2018-05-11 ENCOUNTER — Ambulatory Visit: Payer: BLUE CROSS/BLUE SHIELD | Admitting: Physical Therapy

## 2018-05-11 DIAGNOSIS — M6281 Muscle weakness (generalized): Secondary | ICD-10-CM

## 2018-05-11 DIAGNOSIS — R2689 Other abnormalities of gait and mobility: Secondary | ICD-10-CM | POA: Diagnosis not present

## 2018-05-11 DIAGNOSIS — M533 Sacrococcygeal disorders, not elsewhere classified: Secondary | ICD-10-CM

## 2018-05-11 NOTE — Patient Instructions (Signed)
Sleep with pillow between knees when on your side    Before getting out of bed:  Lift pelvic up and scoot under,  rock pelvis forward and backward 10 reps  Rock knees side to side ~30 deg    ______  Begin deep core exercise  6 min  Moving one knee out ~30 deg without moving opposite hip  Inhale , exhale, then move the knee

## 2018-05-11 NOTE — Therapy (Signed)
[place] MAIN [facility] SERVICES [address] [place], [place], [address] Phone: [phone]   Fax:  [phone]  Physical Therapy Treatment  Patient Details  Name: Julie Fry MRN: [id number] Date of Birth: 1976/04/07 Referring Provider: Hassan Buckler MD    Encounter Date: 05/11/2018  PT End of Session - 05/11/18 1205    Visit Number  3    Number of Visits  12    Date for PT Re-Evaluation  07/22/18    PT Start Time  1004    PT Stop Time  1100    PT Time Calculation (min)  56 min    Activity Tolerance  Patient tolerated treatment well;No increased pain    Behavior During Therapy  WFL for tasks assessed/performed       Past Medical History:  Diagnosis Date  . Medical history non-contributory     Past Surgical History:  Procedure Laterality Date  . DILATION AND EVACUATION N/A 08/07/2015   Procedure: DILATATION AND EVACUATION;  Surgeon: Boykin Nearing, MD;  Location: [facility] ORS;  Service: Gynecology;  Laterality: N/A;    There were no vitals filed for this visit.  Subjective Assessment - 05/11/18 1015    Subjective  Pt report she felt good after last session at a pain level of  4-5/10 from 6/10 ( prior to Tx) which lasted 4 days. Pt is trying to take less pain medication ( OTC Ibuprofen)  but she has not told her doctor. Pt has completed her Oxycodone and she use only given 10 pills and she can not get more after that. Pt would like to ask for more pain medication because her pain is worse in the morning.Sharp pains occur with sitting and getting out of bed.       Patient is accompained by:  Family member   boyfriend ( father of child)    Pertinent History  Hx of 1st vaginal delivery without complications. 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            [facility] PT Assessment - 05/11/18 1058      Strength   Overall Strength  Comments  hip flex, knee flex, ext B 4/5 without lumbopelvic perturbations       Palpation   SI assessment   R ASIS sligtly more inferior/ posterior     Palpation comment  tenderness at B ischial tuberosity, L coccygeus with report of sharp pain like a "needle "       Ambulation/Gait   Gait Comments  pre Tx: antalgic, decreased L weight bearing, psot Tx: normal gait . Sharp pain L pubic  with narrow BOS, cued for  wider BOS and reported no pain                 Pelvic Floor Special Questions - 05/11/18 1057    External Perineal Exam  consented verbally with undergarments donned      External Palpation  increased tensions./ tightness L ischiocavernosus, coccygeus/ obt int L > R         [facility] Adult PT Treatment/Exercise - 05/11/18 1058      Manual Therapy   Manual therapy comments  external: STM, MWM at increased tensions./ tightness L ischiocavernosus, coccygeus, B ischial tuberosity                   PT Long Term Goals - 04/29/18 1835  PT LONG TERM GOAL #1   Title  Pt will decrease her PGQ score from 61% to < 56% in order to return to ADLs     Baseline  61%    Time  12    Period  Weeks    Status  New    Target Date  07/22/18      PT LONG TERM GOAL #2   Title  Pt will demo equal pelvic girdle alignment across 2 visits in order to roll in bed and get out of chair with less pain    Baseline  L iliac crest higher > R     Time  2    Period  Weeks    Status  New    Target Date  05/13/18      PT LONG TERM GOAL #3   Title  Pt will demo no pain and demo restored ROM  with B  hip ext and hip flexion in standing and stepping in order to wean off RW for support when walking    Baseline  pain with hip ext in standing and stepping, minimal hip flexion in gait , requiring BUE support with RW    Time  8    Period  Weeks    Status  New    Target Date  06/24/18      PT LONG TERM GOAL #4   Title  Pt will demo proper technique with deep core strengthening HEP  level 1-2 and  pelvic floor strength 3/3/3/3 in order to promote pelvic girdle stability to lift her baby without pain    Baseline  lumbopelvic pertubations with leg movements    Time  6    Period  Weeks    Status  New    Target Date  06/10/18      PT LONG TERM GOAL #5   Title  Pt will demo proper body mechancis in gravity -loaded tasks for work, bathing, caring for baby, cooking in order to resume PLOF in  household chores     Baseline  poor alignment and technique     Time  6    Period  Weeks    Status  New    Target Date  06/10/18      Additional Long Term Goals   Additional Long Term Goals  Yes      PT LONG TERM GOAL #6   Title  Pt will demo increased gait from 0.5 m/s with RW to >1.0 m/s without RW in order to return to PLOF     Baseline  0.5 m/s with RW    Time  4    Period  Weeks    Status  New    Target Date  05/27/18            Plan - 05/11/18 1205    Clinical Impression Statement  Pt is progressing well with report of less pain for 4 days after last session. Addressed pt's report of increased pain upon waking with slpeeing modifications with pillow between knees,  pelvic tilts, and lower trunk rotation HEP.  External pelvic floor assessment showed increased tightenss/ tenderness at L ischiocavernosus, coccgeus/ obt int  > R.  Reproduction of sharp pain with palpation at ischioanal fossa/ ischial tuberosity where perineal scar is suspected. Pt is only 4 weeks s/p delivery and therefore, withheld internal assessment and Tx.  Provided gentle manual Tx and gentle movements for decreasing pelvic floor mm to optimize perineal scar healing from  her L & D.  Pt showed minor pelvic malalignment which was addressed with manual Tx.  Pt reported her pain decreased following Tx. Pt tolerated Tx without increased pain. Pt's gait was no longer antalgic following Tx. Pt reported sharp L pubic pain with walking but this was resolved with cue for wider BOS as pt was walking with a narrow  BOS.  Pt was educated to consult her MD about medications. Pt was provided education about the healing process post partum and pain.   Progressed to deep core level 2 exericse which will help with pelvic strengthening for more stability and to help with pelvic floor lengthening.  Pt continues to benefit form skilled PT.  Interpreter present today.    Rehab Potential  Good    PT Frequency  1x / week    PT Duration  12 weeks    PT Treatment/Interventions  Moist Heat;Traction;Gait training;Stair training;Functional mobility training;Therapeutic activities;Therapeutic exercise;Balance training;Electrical Stimulation;Manual techniques;Neuromuscular re-education;Patient/family education;Taping;Scar mobilization;Energy conservation    Consulted and Agree with Plan of Care  Patient       Patient will benefit from skilled therapeutic intervention in order to improve the following deficits and impairments:  Improper body mechanics, Pain, Increased muscle spasms, Decreased scar mobility, Decreased mobility, Decreased coordination, Decreased endurance, Decreased range of motion, Increased fascial restricitons, Hypomobility, Decreased strength, Decreased safety awareness, Decreased balance, Postural dysfunction, Abnormal gait  Visit Diagnosis: Sacrococcygeal disorders, not elsewhere classified  Muscle weakness (generalized)  Other abnormalities of gait and mobility     Problem List Patient Active Problem List   Diagnosis Date Noted  . Encounter for induction of labor 04/12/2018  . Advanced maternal age in multigravida, second trimester   . Preventative health care 01/14/2017    [name] ,PT, DPT, E-RYT  05/11/2018, 12:24 PM  [place] MAIN [facility] SERVICES [address] [place], [place], [address] Phone: [phone]   Fax:  [phone]  Name: Jabria Loos MRN: [id number] Date of Birth: 01-25-1976

## 2018-05-12 ENCOUNTER — Encounter: Payer: BLUE CROSS/BLUE SHIELD | Admitting: Physical Therapy

## 2018-05-22 ENCOUNTER — Ambulatory Visit: Payer: BLUE CROSS/BLUE SHIELD | Admitting: Physical Therapy

## 2018-05-22 DIAGNOSIS — R2689 Other abnormalities of gait and mobility: Secondary | ICD-10-CM | POA: Diagnosis not present

## 2018-05-22 DIAGNOSIS — M6281 Muscle weakness (generalized): Secondary | ICD-10-CM

## 2018-05-22 DIAGNOSIS — M533 Sacrococcygeal disorders, not elsewhere classified: Secondary | ICD-10-CM | POA: Diagnosis not present

## 2018-05-22 NOTE — Therapy (Signed)
[place] [facility] MAIN REHAB SERVICES [address] [place], , [address] Phone: [phone]   Fax:  [phone]  Physical Therapy Treatment  Patient Details  Name: Julie Fry MRN: [id number] Date of Birth: 08/31/1976 Referring Provider: McVey, Rebecca MD    Encounter Date: 05/22/2018  PT End of Session - 05/22/18 1559    Visit Number  4    Number of Visits  12    Date for PT Re-Evaluation 08/14/2018     PT Start Time  0810    PT Stop Time  0915    PT Time Calculation (min)  65 min    Activity Tolerance  Patient tolerated treatment well;No increased pain    Behavior During Therapy  WFL for tasks assessed/performed       Past Medical History:  Diagnosis Date  . Medical history non-contributory     Past Surgical History:  Procedure Laterality Date  . DILATION AND EVACUATION N/A 08/07/2015   Procedure: DILATATION AND EVACUATION;  Surgeon: Thomas J Schermerhorn, MD;  Location: [facility] ORS;  Service: Gynecology;  Laterality: N/A;    There were no vitals filed for this visit.  Subjective Assessment - 05/22/18 0828    Subjective  Pt notices at the insertion site ( noticable feeling but no-3/10 ) from the delivery when she is breastfeeding her dtr. Pt leans forward towards dtr and only sometimes uses her pillow.     Patient is accompained by:  Family member   boyfriend ( father of child)    Pertinent History  Hx of 1st vaginal delivery without complications. 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            [facility] PT Assessment - 05/22/18 0833      Posture/Postural Control   Posture Comments  thoracolumbar/glut ( LUE/ RLE=4/5, R UE4/5 LUE 3/5 with pertubration at core)       Strength   Overall Strength Comments  hip ext 3/5 B with pain during LLE , hip abd R 3/5,    hip flex/knee flex/hip 4/5 B no lumbopelvic perturbations      Palpation   SI assessment   R ASIS sligtly more inferior/ posterior                 Pelvic Floor Special Questions - 05/22/18 1558    External Perineal Exam   R coccygeus, ischiocavernosus with pain and tenderness and increased tensions ( post Tx: decreased pain and tensions/ tenderness)         [facility] Adult PT Treatment/Exercise - 05/22/18 0833      Neuro Re-ed    Neuro Re-ed Details   see pt instructions    exhale with leg movements, alignment/ technique with HEP     Manual Therapy   Manual therapy comments  long axis distraction B, STM / MWM at R coccygeus, ischiocavernosus              PT Education - 05/22/18 1559    Education Details  HEP    Person(s) Educated  Patient    Methods  Explanation;Demonstration;Tactile cues;Verbal cues;Handout    Comprehension  Returned demonstration;Verbalized understanding          PT Long Term Goals - 05/22/18 0814      PT LONG TERM GOAL #1   Title  Pt will decrease her PGQ score from 61% to < 56% in order to   return to ADLs     Baseline  61%  ( 8/30: 31%)     Time  12    Period  Weeks    Status  Achieved      PT LONG TERM GOAL #2   Title  Pt will demo equal pelvic girdle alignment across 2 visits in order to roll in bed and get out of chair with less pain    Baseline  L iliac crest higher > R  ( 8/30: equal)     Time  2    Period  Weeks    Status  Achieved      PT LONG TERM GOAL #3   Title  Pt will demo no pain and demo restored ROM  with B  hip ext and hip flexion in standing and stepping in order to wean off RW for support when walking    Baseline  pain with hip ext in standing and stepping, minimal hip flexion in gait , requiring BUE support with RW       Time  8    Period  Weeks    Status  Achieved      PT LONG TERM GOAL #4   Title  Pt will demo proper technique with deep core strengthening HEP level 1-2 and  pelvic floor strength 3/3/3/3 in order to promote pelvic girdle stability to lift her baby without  pain    Baseline  lumbopelvic pertubations with leg movements    Time  6    Period  Weeks    Status  On-going      PT LONG TERM GOAL #5   Title  Pt will demo proper body mechancis in gravity -loaded tasks for work, bathing, caring for baby, cooking in order to resume PLOF in  household chores     Baseline  poor alignment and technique     Time  6    Period  Weeks    Status  On-going      Additional Long Term Goals   Additional Long Term Goals  Yes      PT LONG TERM GOAL #6   Title  Pt will demo increased gait from 0.5 m/s with RW to >1.0 m/s without RW in order to return to PLOF     Baseline  0.5 m/s with RW  (8/30: 0.9 m/s )     Time  4    Period  Weeks    Status  Partially Met      PT LONG TERM GOAL #7   Title  Pt will increase her B hip abd/ glut from 3/5 to 4+/5 without pain in order to progress to lifting weights and prepare for return to work tasks     Time  8    Period  Weeks    Status  New    Target Date  07/17/18      PT LONG TERM GOAL #8   Title  Pt will demo proper lifting mechanics with lifting 10lbs from ground, table, and lifting overhead across 10 reps without pain in order to return to working at [employer]     Time  12    Period  Weeks    Status  New    Target Date  08/14/18            Plan - 05/22/18 1600    Clinical Impression Statement  Pt has achieved 3/8 goals and is progressing well towards her remaining goals. Pt has demonstrated  improved  alignment of her pelvic girdle and increased hip flexion/knee flexion/  extensions strength but still has minor malalignment ( R iliac crest  inferior rotation) , R coccygeus/  ischiocacvernosus mm tightness, and  tenderness upon palpation at R pubic symphysis . Pt's gait speed has  improved moderately. Pt 's hip strength remains weak.   Pt is not ready to return to the work tasks she had prior to her pregnancy. The tasks  involved lifting 15-20 lb repeatedly.   Pt would benefit from lighter work duties and  will likely require 2-3 months before demonstrating adequate  strength for repetitive against-gravity, loaded tasks with minimized risk for pelvic dysfunctions. Pt was educated about  ways to minimize straining her pelvic floor to minimize risk for pelvic  prolapse. Pt tolerated today's manual Tx which included external manual Tx  to decrease pelvic floor mm tightness. Also educated pt about proper body mechanics with breastfeeding baby to minimize poor posture and downward strain of pelvic floor mm. Progressed pt to deep core  strengthening. Pt continues to benefit from skilled PT.           Rehab Potential  Good    PT Frequency  1x / week    PT Duration  12 weeks    PT Treatment/Interventions  Moist Heat;Traction;Gait training;Stair training;Functional mobility training;Therapeutic activities;Therapeutic exercise;Balance training;Electrical Stimulation;Manual techniques;Neuromuscular re-education;Patient/family education;Taping;Scar mobilization;Energy conservation    Consulted and Agree with Plan of Care  Patient       Patient will benefit from skilled therapeutic intervention in order to improve the following deficits and impairments:  Improper body mechanics, Pain, Increased muscle spasms, Decreased scar mobility, Decreased mobility, Decreased coordination, Decreased endurance, Decreased range of motion, Increased fascial restricitons, Hypomobility, Decreased strength, Decreased safety awareness, Decreased balance, Postural dysfunction, Abnormal gait  Visit Diagnosis: Sacrococcygeal disorders, not elsewhere classified  Muscle weakness (generalized)  Other abnormalities of gait and mobility     Problem List Patient Active Problem List   Diagnosis Date Noted  . Encounter for induction of labor 04/12/2018  . Advanced maternal age in multigravida, second trimester   . Preventative health care 01/14/2017    [name] ,PT, DPT, E-RYT  05/22/2018, 4:11 PM  [place] MAIN [facility] SERVICES [address] [place], [place], [address] Phone: [phone]   Fax:  [phone]  Name: Lucero Auzenne MRN: [id number] Date of Birth: 1976-08-13

## 2018-05-22 NOTE — Patient Instructions (Addendum)
  Clam Shell 45 Degrees  Without rocking pelvis back   Complimentary stretch: Cross _ foot over _ thigh, opposite knee straight  3 breaths     Cross thigh over thigh, exhale to hug the thighs in with arms pulling back of thigh, shoulders/ head is relaxed down , Use towel behind thigh is needed.  10 reps   ______   Deep core level 1 and 2 ( handout)   ______ Stretch for pelvic floor   "v heels slide away and then back toward buttocks and then rock knee to slight ,  slide heel along at 11 o clock away from buttocks   10 reps

## 2018-05-29 ENCOUNTER — Telehealth: Payer: Self-pay | Admitting: Physical Therapy

## 2018-05-29 ENCOUNTER — Ambulatory Visit: Payer: BLUE CROSS/BLUE SHIELD | Admitting: Physical Therapy

## 2018-05-29 NOTE — Telephone Encounter (Signed)
DPT phoned Dr. Migdalia Dk nurse stating pt needs a work restriction note. DPT advises pt will benefit from more PT treatments for 8 more weeks and is not ready at this point to return to working at her usual work duties which require repeated lifting of 15-20 lbs. DPT emphasized pt is still with pain. Pt shows more equal alignment of pelvic girdle and needs more proper strengthening and healing. RN voiced she will follow up with this request and contact pt about picking up this note today  DPT spoke with pt's husband and pt re: the process above and that he will be contacted when the note is ready. Pelvic PT appts are being held for her. Advised pt to maintain her exercises and to walk 10-15 min as tolerated x 3 x day ( without pushing stroller nor holding baby)  or perform seated marches in order build up strength.  Pt and husband voiced understanding.

## 2018-06-02 ENCOUNTER — Encounter: Payer: BLUE CROSS/BLUE SHIELD | Admitting: Physical Therapy

## 2018-06-09 ENCOUNTER — Encounter: Payer: BLUE CROSS/BLUE SHIELD | Admitting: Physical Therapy

## 2018-06-10 ENCOUNTER — Ambulatory Visit: Payer: BLUE CROSS/BLUE SHIELD | Attending: Obstetrics and Gynecology | Admitting: Physical Therapy

## 2018-06-10 DIAGNOSIS — M6281 Muscle weakness (generalized): Secondary | ICD-10-CM | POA: Insufficient documentation

## 2018-06-10 DIAGNOSIS — M533 Sacrococcygeal disorders, not elsewhere classified: Secondary | ICD-10-CM | POA: Insufficient documentation

## 2018-06-10 DIAGNOSIS — R2689 Other abnormalities of gait and mobility: Secondary | ICD-10-CM | POA: Diagnosis not present

## 2018-06-10 NOTE — Patient Instructions (Addendum)
Watch videos for details   birddog on the floor when baby playing on her baby/ belly   30 reps   Prone Heel Press for strengthening sacro-iliac joints  1. Lie on your belly. If you have an arch in your low back or it feels umcomfortable, place a pillow under your low belly/hips to make sure your low back feel comfortable.   2. Place our forehead on top of your palms.      Widen your knees apart for starting position.   3. Inhale, feel belly and low back expand  4. Exhale, feel belly hug in, press heel together and count aloud for 5 sec. Then relax the heel squeezing.  Perform 10 reps of 5 sec holds. 2 sets/ day.    If you feel entire buttock tighten too much or feel low back pain, apply 50% less effort. As you press your heel together, you will feel as if your pubic bone (front of your pelvis) and sacrum (back of your pelvis) gentle move towards each other or your low abdominal muscles hug in more. ___  Frog stretch:   ankles drop 45 deg on exhale  30 reps   _____ Transition from standing to floor: Wide squat like you are about to pick something up from the floor --> crawl hand down on thigh and then reach other hand onto the ground (all fours)  To get up, all fours--> lifts hips in to Downward Facing Dog  and walk hands backwards to feet --> mini quat --> hands on thighs, then hips then pause HERE  to avoid (moving too quickly up/ blood rush) -->  knees glide forward and roll  Hips up instead of hinging spine up   Getting down with baby Floor to rise with baby   Baby is parallel to you or under arms, kneeling over toes, untucked Scoop baby under forearms as you squeeze shoulder blades back and down to bring baby to you    Tuck toes under, move knees and body towards ottoman/bench Place baby with your body as close to ottoman as possible  Transition to wide minisquat  Exhale to life baby up  ____  Side stepping down hallway 2 laps each side going L and R x 2 xday

## 2018-06-10 NOTE — Therapy (Signed)
[place] MAIN [facility] SERVICES [address] [place], [place], [address] Phone: [phone]   Fax:  [phone]  Physical Therapy Treatment  Patient Details  Name: Julie Fry MRN: [id number] Date of Birth: 01-05-1976 Referring Provider: Hassan Buckler MD    Encounter Date: 06/10/2018  PT End of Session - 06/10/18 1055    Visit Number  5    Number of Visits  12    Date for PT Re-Evaluation  07/22/18    PT Start Time  4315    PT Stop Time  1117    PT Time Calculation (min)  62 min    Activity Tolerance  Patient tolerated treatment well;No increased pain    Behavior During Therapy  WFL for tasks assessed/performed       Past Medical History:  Diagnosis Date  . Medical history non-contributory     Past Surgical History:  Procedure Laterality Date  . DILATION AND EVACUATION N/A 08/07/2015   Procedure: DILATATION AND EVACUATION;  Surgeon: Boykin Nearing, MD;  Location: [facility] ORS;  Service: Gynecology;  Laterality: N/A;    There were no vitals filed for this visit.  Subjective Assessment - 06/10/18 1020    Subjective  Pt reports today pain at the L glut. Pain level is 5/10 and occuring when she stands for the a long time and getting up from a chair. The pubic pain is occurring less.      Patient is accompained by:  Family member   boyfriend ( father of child)    Pertinent History  Hx of 1st vaginal delivery without complications. 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            [facility] PT Assessment - 06/10/18 1023      Single Leg Stance   Comments  no instability with SLS on both legs       AROM   Overall AROM Comments  R sideflexion with reproduction of L glut pain . no  pain post Tx        Strength   Overall Strength Comments  hip abd B 3+/5, hip ext 3+/5 B       Palpation   SI assessment   iliac crest levelled,      Palpation comment  increased tenderness and tensions at L glut attachments at lateral border of L sacrum, and sacrococcygeal ligament, and gut med L , hypomobillity ( flexion lacking at coccyx                    [facility] Adult PT Treatment/Exercise - 06/10/18 1056      Neuro Re-ed    Neuro Re-ed Details   see pt instructions    exhale with leg movements, alignment/ technique with HEP     Exercises   Exercises  --   see pt instructions     Moist Heat Therapy   Number Minutes Moist Heat  5 Minutes    Moist Heat Location  Other (comment)   sacrum     Manual Therapy   Manual therapy comments  long axis distraction, PA mobs and inferior glide at L SIJ, STM at glut L and sacrococcygeal area                   PT Long Term Goals - 05/22/18 0814      PT LONG TERM GOAL #1  Title  Pt will decrease her PGQ score from 61% to < 56% in order to return to ADLs     Baseline  61%  ( 8/30: 31%)     Time  12    Period  Weeks    Status  Achieved      PT LONG TERM GOAL #2   Title  Pt will demo equal pelvic girdle alignment across 2 visits in order to roll in bed and get out of chair with less pain    Baseline  L iliac crest higher > R  ( 8/30: equal)     Time  2    Period  Weeks    Status  Achieved      PT LONG TERM GOAL #3   Title  Pt will demo no pain and demo restored ROM  with B  hip ext and hip flexion in standing and stepping in order to wean off RW for support when walking    Baseline  pain with hip ext in standing and stepping, minimal hip flexion in gait , requiring BUE support with RW       Time  8    Period  Weeks    Status  Achieved      PT LONG TERM GOAL #4   Title  Pt will demo proper technique with deep core strengthening HEP level 1-2 and  pelvic floor strength 3/3/3/3 in order to promote pelvic girdle stability to lift her baby without pain    Baseline  lumbopelvic pertubations with leg movements    Time  6    Period  Weeks    Status  On-going       PT LONG TERM GOAL #5   Title  Pt will demo proper body mechancis in gravity -loaded tasks for work, bathing, caring for baby, cooking in order to resume PLOF in  household chores     Baseline  poor alignment and technique     Time  6    Period  Weeks    Status  On-going      Additional Long Term Goals   Additional Long Term Goals  Yes      PT LONG TERM GOAL #6   Title  Pt will demo increased gait from 0.5 m/s with RW to >1.0 m/s without RW in order to return to PLOF     Baseline  0.5 m/s with RW  (8/30: 0.9 m/s )     Time  4    Period  Weeks    Status  Partially Met      PT LONG TERM GOAL #7   Title  Pt will increase her B hip abd/ glut from 3/5 to 4+/5 without pain in order to progress to lifting weights and prepare for return to work tasks     Time  8    Period  Weeks    Status  New    Target Date  07/17/18      PT LONG TERM GOAL #8   Title  Pt will demo proper lifting mechanics with lifting 10lbs from ground, table, and lifting overhead across 10 reps without pain in order to return to working at [employer]     Time  12    Period  Weeks    Status  New    Target Date  08/14/18            Plan - 06/10/18 1456    Clinical Impression Statement  Pt shows increased hip strength and equal iliac crest alignment. Pt no longer showed posterior pelvic floor tensions. Pt had significant L glut medius mm tightness and tightness with L sacroiliac/ coccgyx mobility which imporved post Tx. Advanced pt to quadriped thoracolumbar/ posterior LE strengthening along wtih CKC hip abduction strengthening today. Provided body mechanics with stand to floor t/f with and without baby in arms to minimize back and pelvic floor strain. HEP was recorded on pt's husband's phone by husband to yield compliance. Pt received a work restriction note from her OB doctor to withhold from work for 2 months.   Pt continues to benefit from skilled PT.       Rehab Potential  Good    PT Frequency  1x / week    PT  Duration  12 weeks    PT Treatment/Interventions  Moist Heat;Traction;Gait training;Stair training;Functional mobility training;Therapeutic activities;Therapeutic exercise;Balance training;Electrical Stimulation;Manual techniques;Neuromuscular re-education;Patient/family education;Taping;Scar mobilization;Energy conservation    Consulted and Agree with Plan of Care  Patient       Patient will benefit from skilled therapeutic intervention in order to improve the following deficits and impairments:  Improper body mechanics, Pain, Increased muscle spasms, Decreased scar mobility, Decreased mobility, Decreased coordination, Decreased endurance, Decreased range of motion, Increased fascial restricitons, Hypomobility, Decreased strength, Decreased safety awareness, Decreased balance, Postural dysfunction, Abnormal gait  Visit Diagnosis: Sacrococcygeal disorders, not elsewhere classified  Muscle weakness (generalized)  Other abnormalities of gait and mobility     Problem List Patient Active Problem List   Diagnosis Date Noted  . Encounter for induction of labor 04/12/2018  . Advanced maternal age in multigravida, second trimester   . Preventative health care 01/14/2017    [name] ,PT, DPT, E-RYT  06/10/2018, 3:01 PM  [place] MAIN [facility] SERVICES [address] [place], [place], [address] Phone: [phone]   Fax:  [phone]  Name: Keana Dueitt MRN: [id number] Date of Birth: 26-Apr-1976

## 2018-06-12 ENCOUNTER — Encounter: Payer: BLUE CROSS/BLUE SHIELD | Admitting: Physical Therapy

## 2018-06-15 ENCOUNTER — Encounter: Payer: BLUE CROSS/BLUE SHIELD | Admitting: Physical Therapy

## 2018-06-18 ENCOUNTER — Ambulatory Visit: Payer: BLUE CROSS/BLUE SHIELD | Admitting: Physical Therapy

## 2018-06-19 ENCOUNTER — Ambulatory Visit: Payer: BLUE CROSS/BLUE SHIELD | Admitting: Physical Therapy

## 2018-06-19 DIAGNOSIS — R2689 Other abnormalities of gait and mobility: Secondary | ICD-10-CM

## 2018-06-19 DIAGNOSIS — M533 Sacrococcygeal disorders, not elsewhere classified: Secondary | ICD-10-CM | POA: Diagnosis not present

## 2018-06-19 DIAGNOSIS — M6281 Muscle weakness (generalized): Secondary | ICD-10-CM

## 2018-06-19 NOTE — Therapy (Signed)
[place] MAIN [facility] SERVICES [address] [place], [place], [address] Phone: [phone]   Fax:  [phone]  Physical Therapy Treatment  Patient Details  Name: Julie Fry MRN: [id number] Date of Birth: 02-28-1976 Referring Provider (PT): Hassan Buckler MD    Encounter Date: 06/19/2018  PT End of Session - 06/19/18 0839    Visit Number  6    Number of Visits  12    Date for PT Re-Evaluation  07/22/18    PT Start Time  0803    PT Stop Time  0845    PT Time Calculation (min)  42 min    Activity Tolerance  Patient tolerated treatment well;No increased pain    Behavior During Therapy  WFL for tasks assessed/performed       Past Medical History:  Diagnosis Date  . Medical history non-contributory     Past Surgical History:  Procedure Laterality Date  . DILATION AND EVACUATION N/A 08/07/2015   Procedure: DILATATION AND EVACUATION;  Surgeon: Boykin Nearing, MD;  Location: [facility] ORS;  Service: Gynecology;  Laterality: N/A;    There were no vitals filed for this visit.  Subjective Assessment - 06/19/18 0836    Subjective  Pt has not had any pubic pain. Pt notices buttock aches from her exercises    Patient is accompained by:  Family member   boyfriend ( father of child)    Pertinent History  Hx of 1st vaginal delivery without complications. 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            [facility] PT Assessment - 06/19/18 0838      Strength   Overall Strength Comments  hip abd 4/5 B, hip ext 3+/5 L, R 4/5                 Pelvic Floor Special Questions - 06/19/18 0837    External Perineal Exam  no pelvic floor mm tightness        [facility] Adult PT Treatment/Exercise - 06/19/18 0839      Neuro Re-ed    Neuro Re-ed Details   see pt instructions    exhale with leg movements, alignment/ technique with HEP     Exercises   Exercises  --   see pt instructions                 PT Long Term Goals - 05/22/18 3244      PT LONG TERM GOAL #1   Title  Pt will decrease her PGQ score from 61% to < 56% in order to return to ADLs     Baseline  61%  ( 8/30: 31%)     Time  12    Period  Weeks    Status  Achieved      PT LONG TERM GOAL #2   Title  Pt will demo equal pelvic girdle alignment across 2 visits in order to roll in bed and get out of chair with less pain    Baseline  L iliac crest higher > R  ( 8/30: equal)     Time  2    Period  Weeks    Status  Achieved      PT LONG TERM GOAL #3   Title  Pt will demo no pain and demo restored ROM  with B  hip ext and hip flexion in standing  and stepping in order to wean off RW for support when walking    Baseline  pain with hip ext in standing and stepping, minimal hip flexion in gait , requiring BUE support with RW       Time  8    Period  Weeks    Status  Achieved      PT LONG TERM GOAL #4   Title  Pt will demo proper technique with deep core strengthening HEP level 1-2 and  pelvic floor strength 3/3/3/3 in order to promote pelvic girdle stability to lift her baby without pain    Baseline  lumbopelvic pertubations with leg movements    Time  6    Period  Weeks    Status  On-going      PT LONG TERM GOAL #5   Title  Pt will demo proper body mechancis in gravity -loaded tasks for work, bathing, caring for baby, cooking in order to resume PLOF in  household chores     Baseline  poor alignment and technique     Time  6    Period  Weeks    Status  On-going      Additional Long Term Goals   Additional Long Term Goals  Yes      PT LONG TERM GOAL #6   Title  Pt will demo increased gait from 0.5 m/s with RW to >1.0 m/s without RW in order to return to PLOF     Baseline  0.5 m/s with RW  (8/30: 0.9 m/s )     Time  4    Period  Weeks    Status  Partially Met      PT LONG TERM GOAL #7   Title  Pt will increase her B hip abd/ glut from 3/5  to 4+/5 without pain in order to progress to lifting weights and prepare for return to work tasks     Time  8    Period  Weeks    Status  New    Target Date  07/17/18      PT LONG TERM GOAL #8   Title  Pt will demo proper lifting mechanics with lifting 10lbs from ground, table, and lifting overhead across 10 reps without pain in order to return to working at [employer]     Time  12    Period  Weeks    Status  New    Target Date  08/14/18            Plan - 06/19/18 0839    Clinical Impression Statement  Pt showed no pelvic floor mm tightness and will be ready for pelvic floor strengthenign at upcoming sessions. Focused today on strengthening posterior chain with bridges and thoracolumbar strengthening. with resistance band.  Added mini squat with bicep curl to simulate lifting tasks. Cued for exhalation. Pt continues to benefit from skilled PT.      Rehab Potential  Good    PT Frequency  1x / week    PT Duration  12 weeks    PT Treatment/Interventions  Moist Heat;Traction;Gait training;Stair training;Functional mobility training;Therapeutic activities;Therapeutic exercise;Balance training;Electrical Stimulation;Manual techniques;Neuromuscular re-education;Patient/family education;Taping;Scar mobilization;Energy conservation    Consulted and Agree with Plan of Care  Patient       Patient will benefit from skilled therapeutic intervention in order to improve the following deficits and impairments:  Improper body mechanics, Pain, Increased muscle spasms, Decreased scar mobility, Decreased mobility, Decreased coordination, Decreased endurance, Decreased range of motion,  Increased fascial restricitons, Hypomobility, Decreased strength, Decreased safety awareness, Decreased balance, Postural dysfunction, Abnormal gait  Visit Diagnosis: Sacrococcygeal disorders, not elsewhere classified  Muscle weakness (generalized)  Other abnormalities of gait and mobility     Problem List Patient  Active Problem List   Diagnosis Date Noted  . Encounter for induction of labor 04/12/2018  . Advanced maternal age in multigravida, second trimester   . Preventative health care 01/14/2017    [name] ,PT, DPT, E-RYT  06/19/2018, 9:49 AM  [place] MAIN [facility] SERVICES [address] [place], [place], [address] Phone: [phone]   Fax:  [phone]  Name: Julie Fry MRN: [id number] Date of Birth: 01-02-1976

## 2018-06-19 NOTE — Patient Instructions (Signed)
Bridging series w/ resistive band other side of doorknob:  Level 1:  Position:  Elbows bent, knees hip width apart, heels under knees on top of stable  foot stool   Stabilization points: shoulders, upper arms, back of head pressed into floor. Heel press downward.   Movement: inhale do nothing, exhale pull band by side, lower fists to floor completely while lifting hips.Keep stabilization points engaged when you allow the band to go back to starting position  10 x 3 reps       Level 2:  Position:  Elbows straight, arms raised to ceiling at shoulder height, knees apart like a ballerina,heels together, heels under knees, on top of stable  foot stool   Stabilization points: shoulders, upper arms, back of head pressed into floor. Heel press downward.   Movement: inhale do nothing, exhale pull band by side, lower fists to floor completely while lifting hips. Keep stabilization points engaged when you allow the band to go back to starting position   10 x 3 reps  Shoulder training: Try to imagine you are squeezing a pencil under your armpit and your shoulder blades are down away from your ears and towards each other     _______  Mini squat with bicep curls  Squat firest, knees behind toes, than exhale to stand then pull band to rib level  30 reps     _____   Stretches:   Figure -4     Cross the thigh over     Happy [employer]

## 2018-06-22 ENCOUNTER — Encounter: Payer: BLUE CROSS/BLUE SHIELD | Admitting: Physical Therapy

## 2018-06-25 ENCOUNTER — Ambulatory Visit: Payer: BLUE CROSS/BLUE SHIELD | Attending: Obstetrics and Gynecology | Admitting: Physical Therapy

## 2018-06-25 DIAGNOSIS — M533 Sacrococcygeal disorders, not elsewhere classified: Secondary | ICD-10-CM | POA: Diagnosis not present

## 2018-06-25 DIAGNOSIS — R2689 Other abnormalities of gait and mobility: Secondary | ICD-10-CM

## 2018-06-25 DIAGNOSIS — M6281 Muscle weakness (generalized): Secondary | ICD-10-CM | POA: Diagnosis not present

## 2018-06-25 NOTE — Therapy (Signed)
[place] MAIN [facility] SERVICES [address] [place], [place], [address] Phone: [phone]   Fax:  [phone]  Physical Therapy Treatment  Patient Details  Name: Francie Keeling MRN: [id number] Date of Birth: 01-08-1976 Referring Provider (PT): Hassan Buckler MD    Encounter Date: 06/25/2018  PT End of Session - 06/25/18 1005    Visit Number  7    Number of Visits  12    Date for PT Re-Evaluation  07/22/18    PT Start Time  0810    PT Stop Time  0850    PT Time Calculation (min)  40 min    Activity Tolerance  Patient tolerated treatment well;No increased pain    Behavior During Therapy  WFL for tasks assessed/performed       Past Medical History:  Diagnosis Date  . Medical history non-contributory     Past Surgical History:  Procedure Laterality Date  . DILATION AND EVACUATION N/A 08/07/2015   Procedure: DILATATION AND EVACUATION;  Surgeon: Boykin Nearing, MD;  Location: [facility] ORS;  Service: Gynecology;  Laterality: N/A;    There were no vitals filed for this visit.  Subjective Assessment - 06/25/18 0927    Subjective  Pt feels she is still hurting in the glut     Patient is accompained by:  Interpreter   boyfriend ( father of child)    Pertinent History  Hx of 1st vaginal delivery without complications. 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                       Pelvic Floor Special Questions - 06/25/18 1003    Pelvic Floor Internal Exam  pt consented verbally without contrainidcations    Exam Type  Vaginal    Palpation  increased tendernss/ tightness at posterior mm B ( L>R), limited pelvic floor lengthening     Strength  weak squeeze, no lift        [facility] Adult PT Treatment/Exercise - 06/25/18 1004      Neuro Re-ed    Neuro Re-ed Details   see pt instructions , proper lengthening of pelvic floor mm        Manual Therapy   Internal Pelvic Floor  STM, MWM to release posterior mm              PT Education - 06/25/18 1000    Education Details  HEP    Person(s) Educated  Patient    Methods  Demonstration;Explanation;Tactile cues;Handout;Verbal cues    Comprehension  Returned demonstration;Verbalized understanding          PT Long Term Goals - 05/22/18 0814      PT LONG TERM GOAL #1   Title  Pt will decrease her PGQ score from 61% to < 56% in order to return to ADLs     Baseline  61%  ( 8/30: 31%)     Time  12    Period  Weeks    Status  Achieved      PT LONG TERM GOAL #2   Title  Pt will demo equal pelvic girdle alignment across 2 visits in order to roll in bed and get out of chair with less pain    Baseline  L iliac crest higher > R  ( 8/30: equal)     Time  2    Period  Weeks    Status  Achieved      PT LONG TERM GOAL #3   Title  Pt will demo no pain and demo restored ROM  with B  hip ext and hip flexion in standing and stepping in order to wean off RW for support when walking    Baseline  pain with hip ext in standing and stepping, minimal hip flexion in gait , requiring BUE support with RW       Time  8    Period  Weeks    Status  Achieved      PT LONG TERM GOAL #4   Title  Pt will demo proper technique with deep core strengthening HEP level 1-2 and  pelvic floor strength 3/3/3/3 in order to promote pelvic girdle stability to lift her baby without pain    Baseline  lumbopelvic pertubations with leg movements    Time  6    Period  Weeks    Status  On-going      PT LONG TERM GOAL #5   Title  Pt will demo proper body mechancis in gravity -loaded tasks for work, bathing, caring for baby, cooking in order to resume PLOF in  household chores     Baseline  poor alignment and technique     Time  6    Period  Weeks    Status  On-going      Additional Long Term Goals   Additional Long Term Goals  Yes      PT LONG TERM GOAL #6   Title  Pt will demo increased  gait from 0.5 m/s with RW to >1.0 m/s without RW in order to return to PLOF     Baseline  0.5 m/s with RW  (8/30: 0.9 m/s )     Time  4    Period  Weeks    Status  Partially Met      PT LONG TERM GOAL #7   Title  Pt will increase her B hip abd/ glut from 3/5 to 4+/5 without pain in order to progress to lifting weights and prepare for return to work tasks     Time  8    Period  Weeks    Status  New    Target Date  07/17/18      PT LONG TERM GOAL #8   Title  Pt will demo proper lifting mechanics with lifting 10lbs from ground, table, and lifting overhead across 10 reps without pain in order to return to working at [employer]     Time  12    Period  Weeks    Status  New    Target Date  08/14/18            Plan - 06/25/18 1005    Clinical Impression Statement  Addressed pt's report of hurting in B gluts with intravgainal assessment and Tx. Pt demo'd increased posterior pelvic floor tightness and tenderness which decreased post Tx. Required cuing to elicit proper pelvic floor lengthening. Pt continues to benefi from skilled PT.     Rehab Potential  Good    PT Frequency  1x / week    PT Duration  12 weeks    PT Treatment/Interventions  Moist Heat;Traction;Gait training;Stair training;Functional mobility training;Therapeutic activities;Therapeutic exercise;Balance training;Electrical Stimulation;Manual techniques;Neuromuscular re-education;Patient/family education;Taping;Scar mobilization;Energy conservation    Consulted and Agree with Plan of Care  Patient       Patient will benefit from skilled therapeutic intervention in order   to improve the following deficits and impairments:  Improper body mechanics, Pain, Increased muscle spasms, Decreased scar mobility, Decreased mobility, Decreased coordination, Decreased endurance, Decreased range of motion, Increased fascial restricitons, Hypomobility, Decreased strength, Decreased safety awareness, Decreased balance, Postural dysfunction, Abnormal  gait  Visit Diagnosis: Sacrococcygeal disorders, not elsewhere classified  Muscle weakness (generalized)  Other abnormalities of gait and mobility     Problem List Patient Active Problem List   Diagnosis Date Noted  . Encounter for induction of labor 04/12/2018  . Advanced maternal age in multigravida, second trimester   . Preventative health care 01/14/2017    Yeung,Shin Yiing ,PT, DPT, E-RYT  06/25/2018, 10:07 AM  [place] [facility] MA[facility] REHAB SERVICES [address] [place], [place], [address] Phone: [phone]   Fax:  [phone]  Name: Tricha Authement MRN: [id number] Date of Birth: 10/22/1975   

## 2018-06-25 NOTE — Patient Instructions (Signed)
Deep core level 2 with proper lengthening of pelvic   Continue with other exercises, maintain stretches

## 2018-06-29 ENCOUNTER — Encounter: Payer: BLUE CROSS/BLUE SHIELD | Admitting: Physical Therapy

## 2018-07-03 ENCOUNTER — Ambulatory Visit: Payer: BLUE CROSS/BLUE SHIELD | Admitting: Physical Therapy

## 2018-07-03 DIAGNOSIS — M533 Sacrococcygeal disorders, not elsewhere classified: Secondary | ICD-10-CM | POA: Diagnosis not present

## 2018-07-03 DIAGNOSIS — M6281 Muscle weakness (generalized): Secondary | ICD-10-CM

## 2018-07-03 DIAGNOSIS — R2689 Other abnormalities of gait and mobility: Secondary | ICD-10-CM

## 2018-07-03 NOTE — Patient Instructions (Addendum)
Two times a day   1) clam shells on L leg only ( R sidelying ) 30 reps   2)  complimentary stretch :  Figure-4 and cross over thigh stretch  5 breaths    Hamstring stretch  Seated  10 reps    3) minisquat with bicep curl  30  Reps    4) Bridging series w/ resistive band other side of doorknob:  Level 1:  Position:  Elbows bent, knees hip width apart, heels under knees on top of stable  foot stool   Stabilization points: shoulders, upper arms, back of head pressed into floor. Heel press downward.   Movement: inhale do nothing, exhale pull band by side, lower fists to floor completely while lifting hips.Keep stabilization points engaged when you allow the band to go back to starting position  15 x 2 reps       Level 2:  Position:  Elbows straight, arms raised to ceiling at shoulder height, knees apart like a ballerina,heels together, heels under knees, on top of stable  foot stool   Stabilization points: shoulders, upper arms, back of head pressed into floor. Heel press downward.   Movement: inhale do nothing, exhale pull band by side, lower fists to floor completely while lifting hips. Keep stabilization points engaged when you allow the band to go back to starting position   15 x 2 reps  Shoulder training: Try to imagine you are squeezing a pencil under your armpit and your shoulder blades are down away from your ears and towards each other     ____    Transition from standing to floor: Wide squat like you are about to pick something up from the floor --> crawl hand down on thigh and then reach other hand onto the ground (all fours)  To get up, all fours--> lifts hips in to Downward Facing Dog  and walk hands backwards to feet --> mini quat --> hands on thighs, then hips then pause HERE  to avoid (moving too quickly up/ blood rush) -->  knees glide forward and roll  Hips up instead of hinging spine up

## 2018-07-03 NOTE — Therapy (Signed)
[place] MAIN [facility] SERVICES [address] [place], [place], [address] Phone: [phone]   Fax:  [phone]  Physical Therapy Treatment  Patient Details  Name: Julie Fry MRN: [id number] Date of Birth: 1975-10-28 Referring Provider (PT): Hassan Buckler MD    Encounter Date: 07/03/2018    Past Medical History:  Diagnosis Date  . Medical history non-contributory     Past Surgical History:  Procedure Laterality Date  . DILATION AND EVACUATION N/A 08/07/2015   Procedure: DILATATION AND EVACUATION;  Surgeon: Boykin Nearing, MD;  Location: [facility] ORS;  Service: Gynecology;  Laterality: N/A;    There were no vitals filed for this visit.  Subjective Assessment - 07/03/18 0813    Subjective  Pt reports the pain is no as noticable in her B gluts like it was before. For the past 2 days, pt has noticed a nerve pain from her L calf to her L gluts. It occurs when she stands     Patient is accompained by:  Interpreter   boyfriend ( father of child)    Pertinent History  Hx of 1st vaginal delivery without complications. 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            [facility] PT Assessment - 07/03/18 0816      Strength   Overall Strength Comments  L hip abd and hip ext 3+/5, R 4/5       Palpation   Palpation comment  L iliac crest higher than R, L PSIS more posterior in standing. sidelying: L SIJ hypomobile.                Pelvic Floor Special Questions - 07/03/18 0846    Pelvic Floor Internal Exam  pt consented verbally without contrainidcations    Exam Type  Vaginal    Palpation  increased tendernss/ tightness at L piriformis/ obturator fascia,  R anterior aspect of obturator internus     Strength  fair squeeze, definite lift   post Tx       [facility] Adult PT Treatment/Exercise - 07/03/18 0848      Neuro Re-ed    Neuro Re-ed Details   see pt instructions , proper lengthening of pelvic floor mm       Manual Therapy   Internal Pelvic Floor  STM/MWM with coordinated breathing at problem areas noted                   PT Long Term Goals - 05/22/18 0814      PT LONG TERM GOAL #1   Title  Pt will decrease her PGQ score from 61% to < 56% in order to return to ADLs     Baseline  61%  ( 8/30: 31%)     Time  12    Period  Weeks    Status  Achieved      PT LONG TERM GOAL #2   Title  Pt will demo equal pelvic girdle alignment across 2 visits in order to roll in bed and get out of chair with less pain    Baseline  L iliac crest higher > R  ( 8/30: equal)     Time  2    Period  Weeks    Status  Achieved      PT LONG TERM GOAL #3   Title  Pt will demo no pain and  demo restored ROM  with B  hip ext and hip flexion in standing and stepping in order to wean off RW for support when walking    Baseline  pain with hip ext in standing and stepping, minimal hip flexion in gait , requiring BUE support with RW       Time  8    Period  Weeks    Status  Achieved      PT LONG TERM GOAL #4   Title  Pt will demo proper technique with deep core strengthening HEP level 1-2 and  pelvic floor strength 3/3/3/3 in order to promote pelvic girdle stability to lift her baby without pain    Baseline  lumbopelvic pertubations with leg movements    Time  6    Period  Weeks    Status  On-going      PT LONG TERM GOAL #5   Title  Pt will demo proper body mechancis in gravity -loaded tasks for work, bathing, caring for baby, cooking in order to resume PLOF in  household chores     Baseline  poor alignment and technique     Time  6    Period  Weeks    Status  On-going      Additional Long Term Goals   Additional Long Term Goals  Yes      PT LONG TERM GOAL #6   Title  Pt will demo increased gait from 0.5 m/s with RW to >1.0 m/s without RW in order to return to PLOF     Baseline  0.5 m/s with RW  (8/30: 0.9 m/s )      Time  4    Period  Weeks    Status  Partially Met      PT LONG TERM GOAL #7   Title  Pt will increase her B hip abd/ glut from 3/5 to 4+/5 without pain in order to progress to lifting weights and prepare for return to work tasks     Time  8    Period  Weeks    Status  New    Target Date  07/17/18      PT LONG TERM GOAL #8   Title  Pt will demo proper lifting mechanics with lifting 10lbs from ground, table, and lifting overhead across 10 reps without pain in order to return to working at [employer]     Time  12    Period  Weeks    Status  New    Target Date  08/14/18            Plan - 07/03/18 1005    Clinical Impression Statement  Pt's report of radiating pain on LLE the past few couples are likely due to tightness of hip external rotators and L SIJ hypomobility. External and internal manual Tx decreased tenderness and tensions and increased mobility to L SIJ.  Added hamstring and hip external rotator stretches into HEP. Progressed with thoracolumbar strengthening resistance exercises.  Focused on L hip abduction strengthening only this week due to L being weaker than R. Pt continues to benefit from skilled PT and is preparing well towards return to work Nov 1.     Rehab Potential  Good    PT Frequency  1x / week    PT Duration  12 weeks    PT Treatment/Interventions  Moist Heat;Traction;Gait training;Stair training;Functional mobility training;Therapeutic activities;Therapeutic exercise;Balance training;Electrical Stimulation;Manual techniques;Neuromuscular re-education;Patient/family education;Taping;Scar mobilization;Energy conservation    Consulted and Agree  with Plan of Care  Patient       Patient will benefit from skilled therapeutic intervention in order to improve the following deficits and impairments:  Improper body mechanics, Pain, Increased muscle spasms, Decreased scar mobility, Decreased mobility, Decreased coordination, Decreased endurance, Decreased range of motion,  Increased fascial restricitons, Hypomobility, Decreased strength, Decreased safety awareness, Decreased balance, Postural dysfunction, Abnormal gait  Visit Diagnosis: Sacrococcygeal disorders, not elsewhere classified  Muscle weakness (generalized)  Other abnormalities of gait and mobility     Problem List Patient Active Problem List   Diagnosis Date Noted  . Encounter for induction of labor 04/12/2018  . Advanced maternal age in multigravida, second trimester   . Preventative health care 01/14/2017    [name] ,PT, DPT, E-RYT  07/03/2018, 10:10 AM  [place] MAIN [facility] SERVICES [address] [place], [place], [address] Phone: [phone]   Fax:  [phone]  Name: Katye Valek MRN: [id number] Date of Birth: Aug 02, 1976

## 2018-07-06 ENCOUNTER — Encounter: Payer: BLUE CROSS/BLUE SHIELD | Admitting: Physical Therapy

## 2018-07-10 ENCOUNTER — Ambulatory Visit: Payer: BLUE CROSS/BLUE SHIELD | Admitting: Physical Therapy

## 2018-07-10 DIAGNOSIS — M533 Sacrococcygeal disorders, not elsewhere classified: Secondary | ICD-10-CM | POA: Diagnosis not present

## 2018-07-10 DIAGNOSIS — M6281 Muscle weakness (generalized): Secondary | ICD-10-CM

## 2018-07-10 DIAGNOSIS — R2689 Other abnormalities of gait and mobility: Secondary | ICD-10-CM | POA: Diagnosis not present

## 2018-07-10 NOTE — Therapy (Signed)
[place] MAIN [facility] SERVICES [address] [place], [place], [address] Phone: [phone]   Fax:  [phone]  Physical Therapy Treatment  Patient Details  Name: Julie Fry MRN: [id number] Date of Birth: 04-02-1976 Referring Provider (PT): Hassan Buckler MD    Encounter Date: 07/10/2018  PT End of Session - 07/10/18 0900    Visit Number  8    Number of Visits  12    Date for PT Re-Evaluation  07/22/18    PT Start Time  0805    PT Stop Time  0900    PT Time Calculation (min)  55 min    Activity Tolerance  Patient tolerated treatment well;No increased pain    Behavior During Therapy  WFL for tasks assessed/performed       Past Medical History:  Diagnosis Date  . Medical history non-contributory     Past Surgical History:  Procedure Laterality Date  . DILATION AND EVACUATION N/A 08/07/2015   Procedure: DILATATION AND EVACUATION;  Surgeon: Boykin Nearing, MD;  Location: [facility] ORS;  Service: Gynecology;  Laterality: N/A;    There were no vitals filed for this visit.  Subjective Assessment - 07/10/18 0850    Subjective  Pt reported she still feels pain in the L SIJ region . She feels sore after doing her exercises   Patient is accompained by:  Interpreter   boyfriend ( father of child)    Pertinent History  Hx of 1st vaginal delivery without complications. 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            [facility] PT Assessment - 07/10/18 0852      Strength   Fry Strength Comments  L hip 3/5 pre Tx, 4-/5 post Tx, R hip ext 4-/5 , hip abd B 4+/5       Palpation   SI assessment   L SIJ hypomobile, limited nutation                   [facility] Adult PT Treatment/Exercise - 07/10/18 0850      Neuro Re-ed    Neuro Re-ed Details   see pt instructions    exhale with leg movements, alignment/ technique with HEP      Exercises   Exercises  --   see pt instructions     Moist Heat Therapy   Moist Heat Location  Other (comment)   sacrum     Manual Therapy   Manual therapy comments  AP/PA mob at SIJ L                   PT Long Term Goals - 05/22/18 7893      PT LONG TERM GOAL #1   Title  Pt will decrease her PGQ score from 61% to < 56% in order to return to ADLs     Baseline  61%  ( 8/30: 31%)     Time  12    Period  Weeks    Status  Achieved      PT LONG TERM GOAL #2   Title  Pt will demo equal pelvic girdle alignment across 2 visits in order to roll in bed and get out of chair with less pain    Baseline  L iliac crest higher > R  ( 8/30: equal)     Time  2    Period  Weeks    Status  Achieved      PT LONG TERM GOAL #3   Title  Pt will demo no pain and demo restored ROM  with B  hip ext and hip flexion in standing and stepping in order to wean off RW for support when walking    Baseline  pain with hip ext in standing and stepping, minimal hip flexion in gait , requiring BUE support with RW       Time  8    Period  Weeks    Status  Achieved      PT LONG TERM GOAL #4   Title  Pt will demo proper technique with deep core strengthening HEP level 1-2 and  pelvic floor strength 3/3/3/3 in order to promote pelvic girdle stability to lift her baby without pain    Baseline  lumbopelvic pertubations with leg movements    Time  6    Period  Weeks    Status  On-going      PT LONG TERM GOAL #5   Title  Pt will demo proper body mechancis in gravity -loaded tasks for work, bathing, caring for baby, cooking in order to resume PLOF in  household chores     Baseline  poor alignment and technique     Time  6    Period  Weeks    Status  On-going      Additional Long Term Goals   Additional Long Term Goals  Yes      PT LONG TERM GOAL #6   Title  Pt will demo increased gait from 0.5 m/s with RW to >1.0 m/s without RW in order to return to PLOF     Baseline  0.5 m/s with RW  (8/30:  0.9 m/s )     Time  4    Period  Weeks    Status  Partially Met      PT LONG TERM GOAL #7   Title  Pt will increase her B hip abd/ glut from 3/5 to 4+/5 without pain in order to progress to lifting weights and prepare for return to work tasks     Time  8    Period  Weeks    Status  New    Target Date  07/17/18      PT LONG TERM GOAL #8   Title  Pt will demo proper lifting mechanics with lifting 10lbs from ground, table, and lifting overhead across 10 reps without pain in order to return to working at [employer]     Time  12    Period  Weeks    Status  New    Target Date  08/14/18            Plan - 07/10/18 0900    Clinical Impression Statement  Pt demo'd less glut/ pelvic floor  tightness with only hypomobility at L SIJ today. Hip abduction B increased. Added hip extension strengthening progression in upright positions. Pt continues to benefit from skilled PT.    Rehab Potential  Good    PT Frequency  1x / week    PT Duration  12 weeks    PT Treatment/Interventions  Moist Heat;Traction;Gait training;Stair training;Functional mobility training;Therapeutic activities;Therapeutic exercise;Balance training;Electrical Stimulation;Manual techniques;Neuromuscular re-education;Patient/family education;Taping;Scar mobilization;Energy conservation    Consulted and Agree with Plan of Care  Patient       Patient will benefit from skilled therapeutic intervention in order to improve the following deficits and impairments:  Improper body mechanics, Pain, Increased muscle spasms, Decreased scar mobility, Decreased mobility, Decreased coordination, Decreased endurance, Decreased range of motion, Increased fascial restricitons, Hypomobility, Decreased strength, Decreased safety awareness, Decreased balance, Postural dysfunction, Abnormal gait  Visit Diagnosis: Sacrococcygeal disorders, not elsewhere classified  Muscle weakness (generalized)  Other abnormalities of gait and  mobility     Problem List Patient Active Problem List   Diagnosis Date Noted  . Encounter for induction of labor 04/12/2018  . Advanced maternal age in multigravida, second trimester   . Preventative health care 01/14/2017    [name] ,PT, DPT, E-RYT  07/10/2018, 9:03 AM  [place] MAIN [facility] SERVICES [address] [place], [place], [address] Phone: [phone]   Fax:  [phone]  Name: Julie Fry MRN: [id number] Date of Birth: 11-06-1975

## 2018-07-10 NOTE — Patient Instructions (Signed)
Wall squat , lean forward, exhale to push off  30 reps      Step up on stool RLE with more weight on [place] on up and down  Switch   30 reps       Leaning on seat,  blue band at chair leg  pull band back with band  30 reps

## 2018-07-13 ENCOUNTER — Encounter: Payer: BLUE CROSS/BLUE SHIELD | Admitting: Physical Therapy

## 2018-07-14 ENCOUNTER — Ambulatory Visit: Payer: BLUE CROSS/BLUE SHIELD | Admitting: Physical Therapy

## 2018-07-14 DIAGNOSIS — M533 Sacrococcygeal disorders, not elsewhere classified: Secondary | ICD-10-CM | POA: Diagnosis not present

## 2018-07-14 DIAGNOSIS — R2689 Other abnormalities of gait and mobility: Secondary | ICD-10-CM

## 2018-07-14 DIAGNOSIS — M6281 Muscle weakness (generalized): Secondary | ICD-10-CM | POA: Diagnosis not present

## 2018-07-14 NOTE — Therapy (Signed)
[place] MAIN [facility] SERVICES [address] [place], [place], [address] Phone: [phone]   Fax:  [phone]  Physical Therapy Treatment/ Progress Note  Patient Details  Name: Julie Fry MRN: [id number] Date of Birth: 1976-07-18 Referring Provider (PT): Hassan Buckler MD    Encounter Date: 07/14/2018  PT End of Session - 07/14/18 1141    Visit Number  9    Date for PT Re-Evaluation  10/06/18    PT Start Time  0810    PT Stop Time  0905    PT Time Calculation (min)  55 min    Activity Tolerance  Patient tolerated treatment well;No increased pain    Behavior During Therapy  WFL for tasks assessed/performed       Past Medical History:  Diagnosis Date  . Medical history non-contributory     Past Surgical History:  Procedure Laterality Date  . DILATION AND EVACUATION N/A 08/07/2015   Procedure: DILATATION AND EVACUATION;  Surgeon: Boykin Nearing, MD;  Location: [facility] ORS;  Service: Gynecology;  Laterality: N/A;    There were no vitals filed for this visit.  Subjective Assessment - 07/14/18 1141    Subjective  Pt reported no pain today. pt will be returning to work in Nov    Patient is accompained by:  Interpreter   boyfriend ( father of child)    Pertinent History  Hx of 1st vaginal delivery without complications. 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            [facility] PT Assessment - 07/14/18 1142      Observation/Other Assessments   Observations  overuse of upper traps in lat pull down resistance training      Other:   Other/ Comments  pushing( simulated task at work): scissoring of feet, excessive use of upper traps, poor mechanics       Other:   Other/Comments  lifting ( simulated task): poor form, decreased BOS                    [facility] Adult PT Treatment/Exercise - 07/14/18 1143      Therapeutic Activites    Therapeutic Activities  --   work Sales executive     Neuro Re-ed    Neuro Re-ed Details   see pt instructions    exhale with leg movements, alignment/ technique with HEP                 PT Long Term Goals - 07/14/18 1144      PT LONG TERM GOAL #1   Title  Pt will decrease her PGQ score from 61% to < 56% in order to return to ADLs     Baseline  61%  ( 8/30: 31%)     Time  12    Period  Weeks    Status  Achieved      PT LONG TERM GOAL #2   Title  Pt will demo equal pelvic girdle alignment across 2 visits in order to roll in bed and get out of chair with less pain    Baseline  L iliac crest higher > R  ( 8/30: equal)     Time  2    Period  Weeks    Status  Achieved      PT LONG TERM GOAL #3   Title  Pt will  demo no pain and demo restored ROM  with B  hip ext and hip flexion in standing and stepping in order to wean off RW for support when walking    Baseline  pain with hip ext in standing and stepping, minimal hip flexion in gait , requiring BUE support with RW       Time  8    Period  Weeks    Status  Achieved      PT LONG TERM GOAL #4   Title  Pt will demo proper technique with deep core strengthening HEP level 1-2 and  pelvic floor strength 3/3/3/3 in order to promote pelvic girdle stability to lift her baby without pain    Baseline  lumbopelvic pertubations with leg movements    Time  6    Period  Weeks    Status  Partially Met      PT LONG TERM GOAL #5   Title  Pt will demo proper body mechancis in gravity -loaded tasks for work, bathing, caring for baby, cooking in order to resume PLOF in  household chores     Baseline  poor alignment and technique     Time  6    Period  Weeks    Status  Achieved      PT LONG TERM GOAL #6   Title  Pt will demo increased gait from 0.5 m/s with RW to >1.0 m/s without RW in order to return to PLOF     Baseline  0.5 m/s with RW  (8/30: 0.9 m/s, 10/22: 1.0 m/s  )     Time  4    Period   Weeks    Status  Achieved      PT LONG TERM GOAL #7   Title  Pt will increase her B hip abd/ glut from 3/5 to 4+/5 without pain in order to progress to lifting weights and prepare for return to work tasks     Time  8    Period  Weeks    Status  Partially Met      PT LONG TERM GOAL #8   Title  Pt will demo proper lifting mechanics with lifting 10lbs from ground, table, and lifting overhead across 10 reps without pain in order to return to working at [employer]     Time  12    Period  Weeks    Status  Partially Met            Plan - 07/14/18 1141    Clinical Impression Statement  Pt has achieved 5/8 goals. Pt's pelvic girdle pain has resolved and she demo'd increased hip and deep core strength. Pt has progressed to upright strengthening exercises to prepare for the repeated lifting, pulling, pushing of 15-25 lb at work. Pt will be returning to work in Nov. Pt demo'd simulated work tasks today with [employer] after training. Plan to continue to treat pt with skilled PT 1-2x month once pt returns to work to minimize relapse of pain.  Pt still requires more strengthening to achieve her remaining goals.    Rehab Potential  Good    PT Frequency  1x / week    PT Duration  12 weeks    PT Treatment/Interventions  Moist Heat;Traction;Gait training;Stair training;Functional mobility training;Therapeutic activities;Therapeutic exercise;Balance training;Electrical Stimulation;Manual techniques;Neuromuscular re-education;Patient/family education;Taping;Scar mobilization;Energy conservation    Consulted and Agree with Plan of Care  Patient       Patient will benefit from skilled therapeutic intervention in  order to improve the following deficits and impairments:  Improper body mechanics, Pain, Increased muscle spasms, Decreased scar mobility, Decreased mobility, Decreased coordination, Decreased endurance, Decreased range of motion, Increased fascial restricitons, Hypomobility, Decreased strength,  Decreased safety awareness, Decreased balance, Postural dysfunction, Abnormal gait  Visit Diagnosis: Sacrococcygeal disorders, not elsewhere classified  Muscle weakness (generalized)  Other abnormalities of gait and mobility     Problem List Patient Active Problem List   Diagnosis Date Noted  . Encounter for induction of labor 04/12/2018  . Advanced maternal age in multigravida, second trimester   . Preventative health care 01/14/2017    [name] ,PT, DPT, E-RYT  07/14/2018, 11:51 AM  [place] MAIN [facility] SERVICES [address] [place], [place], [address] Phone: [phone]   Fax:  [phone]  Name: Charlesa Ehle MRN: [id number] Date of Birth: 08-05-1976

## 2018-07-14 NOTE — Patient Instructions (Addendum)
    Pushing cart at work: Elbows by your side, use legs to push, leaning forward, knees bent   Lifting with wider feet, keep box closer to body, move feet first, exhale to lift  Stretches at work ( see video )

## 2018-07-20 ENCOUNTER — Encounter: Payer: BLUE CROSS/BLUE SHIELD | Admitting: Physical Therapy

## 2018-07-27 ENCOUNTER — Ambulatory Visit: Payer: BLUE CROSS/BLUE SHIELD | Admitting: Physical Therapy

## 2018-09-07 ENCOUNTER — Ambulatory Visit (INDEPENDENT_AMBULATORY_CARE_PROVIDER_SITE_OTHER): Payer: BLUE CROSS/BLUE SHIELD | Admitting: Nurse Practitioner

## 2018-09-07 ENCOUNTER — Encounter: Payer: Self-pay | Admitting: Nurse Practitioner

## 2018-09-07 VITALS — BP 122/78 | HR 86 | Temp 98.0°F | Resp 16 | Ht 59.0 in | Wt 113.2 lb

## 2018-09-07 DIAGNOSIS — N926 Irregular menstruation, unspecified: Secondary | ICD-10-CM | POA: Diagnosis not present

## 2018-09-07 LAB — POCT URINE PREGNANCY: PREG TEST UR: NEGATIVE

## 2018-09-07 NOTE — Patient Instructions (Addendum)
-   We are going to order some blood tests to look at some hormones that can caused missed periods; based off this result we will decide whether to send you to a specialist or treat, or just monitor - If you have any new or concerning symptoms please come back in sooner, if not keep an eye on missed periods and follow up in 2-3 months.  - Recommend a Women's Health vitamin (includes vitamin D & C, iron, folic acid)

## 2018-09-07 NOTE — Progress Notes (Signed)
Name: Julie Fry   MRN: [id number]    DOB: 25-Jan-1976   Date:09/07/2018       Progress Note  Subjective  Chief Complaint  Chief Complaint  Patient presents with  . Amenorrhea    missed period for past 2 months    HPI  Patient endorses two missed periods. LMP 07/09/2018- normal 3-4 days with moderate bleeding. Last period was at her baseline. Patient gave birth 04/15/2018- had period September and October. Patient using condoms - every time. Patient denies mood swings, hot flashes, vaginal dryness, lower abdominal cramping.  Patient Active Problem List   Diagnosis Date Noted  . Encounter for induction of labor 04/12/2018  . Advanced maternal age in multigravida, second trimester   . Preventative health care 01/14/2017    Past Medical History:  Diagnosis Date  . Medical history non-contributory     Past Surgical History:  Procedure Laterality Date  . DILATION AND EVACUATION N/A 08/07/2015   Procedure: DILATATION AND EVACUATION;  Surgeon: Boykin Nearing, MD;  Location: [facility] ORS;  Service: Gynecology;  Laterality: N/A;    Social History   Tobacco Use  . Smoking status: Never Smoker  . Smokeless tobacco: Never Used  Substance Use Topics  . Alcohol use: No    Alcohol/week: 1.0 standard drinks    Types: 1 Glasses of wine per week    Frequency: Never     Current Outpatient Medications:  .  ibuprofen (ADVIL,MOTRIN) 600 MG tablet, Take 1 tablet (600 mg total) by mouth every 6 (six) hours as needed for mild pain, moderate pain or cramping., Disp: 90 tablet, Rfl: 0 .  acetaminophen (TYLENOL) 325 MG tablet, Take 2 tablets (650 mg total) by mouth every 4 (four) hours as needed for mild pain or moderate pain. (Patient not taking: Reported on 09/07/2018), Disp: 90 tablet, Rfl: 0 .  oxyCODONE (OXY IR/ROXICODONE) 5 MG immediate release tablet, Take 5 mg by mouth every 4 (four) hours as needed for severe pain., Disp: , Rfl:  .  Prenatal Vit-Fe Fumarate-FA (PRENATAL  VITAMIN) 27-0.8 MG TABS, Take 1 tablet by mouth daily. (Patient not taking: Reported on 09/07/2018), Disp: 30 tablet, Rfl: 0  No Known Allergies  ROS   No other specific complaints in a complete review of systems (except as listed in HPI above).  Objective  Vitals:   09/07/18 1353  BP: 122/78  Pulse: 86  Resp: 16  Temp: 98 F (36.7 C)  TempSrc: Oral  SpO2: 99%  Weight: 113 lb 3.2 oz (51.3 kg)  Height: 4\' 11"  (1.499 m)    Body mass index is 22.86 kg/m.  Nursing Note and Vital Signs reviewed.  Physical Exam Constitutional:      Appearance: Normal appearance.  Neck:     Musculoskeletal: Normal range of motion and neck supple.  Cardiovascular:     Rate and Rhythm: Normal rate and regular rhythm.  Pulmonary:     Effort: Pulmonary effort is normal.     Breath sounds: Normal breath sounds.  Abdominal:     General: Abdomen is flat. Bowel sounds are normal.     Palpations: Abdomen is soft.     Tenderness: There is no abdominal tenderness.  Neurological:     General: No focal deficit present.     Mental Status: She is alert and oriented to person, place, and time.  Psychiatric:        Mood and Affect: Mood normal.        Thought Content: Thought content normal.  No results found for this or any previous visit (from the past 48 hour(s)).  Assessment & Plan  1. Missed periods - We are going to order some blood tests to look at some hormones that can caused missed periods; based off this result we will decide whether to send you to a specialist or treat, or just monitor - If you have any new or concerning symptoms please come back in sooner, if not keep an eye on missed periods and follow up in 2-3 months.  - Recommend a Women's Health vitamin (includes vitamin D & C, iron, folic acid)  - POCT urine pregnancy - FSH/LH - TSH

## 2018-09-08 LAB — FSH/LH
FSH: 36.2 m[IU]/mL
LH: 21.2 m[IU]/mL

## 2018-09-08 LAB — TSH: TSH: 1.53 mIU/L

## 2018-09-10 ENCOUNTER — Telehealth: Payer: Self-pay | Admitting: Nurse Practitioner

## 2018-09-10 NOTE — Telephone Encounter (Signed)
Will you ask if patient is still breast feeding?

## 2018-09-10 NOTE — Telephone Encounter (Signed)
I called this patient but her boyfriend stated that she was at work. I then asked is she was still nursing and he said that she wasn't.

## 2018-12-11 DIAGNOSIS — Z7689 Persons encountering health services in other specified circumstances: Secondary | ICD-10-CM | POA: Diagnosis not present

## 2019-01-21 ENCOUNTER — Encounter: Payer: BLUE CROSS/BLUE SHIELD | Admitting: Family Medicine

## 2019-03-12 DIAGNOSIS — D2230 Melanocytic nevi of unspecified part of face: Secondary | ICD-10-CM | POA: Diagnosis not present

## 2019-03-12 DIAGNOSIS — D224 Melanocytic nevi of scalp and neck: Secondary | ICD-10-CM | POA: Diagnosis not present

## 2019-03-12 DIAGNOSIS — L812 Freckles: Secondary | ICD-10-CM | POA: Diagnosis not present

## 2019-03-12 DIAGNOSIS — L814 Other melanin hyperpigmentation: Secondary | ICD-10-CM | POA: Diagnosis not present

## 2019-04-22 DIAGNOSIS — L811 Chloasma: Secondary | ICD-10-CM | POA: Diagnosis not present

## 2019-04-22 DIAGNOSIS — Z872 Personal history of diseases of the skin and subcutaneous tissue: Secondary | ICD-10-CM | POA: Diagnosis not present

## 2019-04-22 DIAGNOSIS — L814 Other melanin hyperpigmentation: Secondary | ICD-10-CM | POA: Diagnosis not present

## 2019-08-30 DIAGNOSIS — Z20828 Contact with and (suspected) exposure to other viral communicable diseases: Secondary | ICD-10-CM | POA: Diagnosis not present

## 2019-10-12 ENCOUNTER — Emergency Department
Admission: EM | Admit: 2019-10-12 | Discharge: 2019-10-12 | Disposition: A | Discharge status: Home / Self Care | Payer: BC Managed Care – PPO | Attending: Emergency Medicine | Admitting: Emergency Medicine

## 2019-10-12 ENCOUNTER — Other Ambulatory Visit: Payer: Self-pay

## 2019-10-12 ENCOUNTER — Encounter: Payer: Self-pay | Admitting: Emergency Medicine

## 2019-10-12 ENCOUNTER — Emergency Department: Payer: BC Managed Care – PPO

## 2019-10-12 ENCOUNTER — Ambulatory Visit: Payer: Self-pay | Admitting: *Deleted

## 2019-10-12 DIAGNOSIS — R102 Pelvic and perineal pain: Secondary | ICD-10-CM | POA: Diagnosis not present

## 2019-10-12 DIAGNOSIS — M8538 Osteitis condensans, other site: Secondary | ICD-10-CM

## 2019-10-12 DIAGNOSIS — M8530 Osteitis condensans, unspecified site: Secondary | ICD-10-CM | POA: Diagnosis not present

## 2019-10-12 LAB — URINALYSIS, COMPLETE (UACMP) WITH MICROSCOPIC
Bilirubin Urine: NEGATIVE
Glucose, UA: NEGATIVE mg/dL
Ketones, ur: NEGATIVE mg/dL
Leukocytes,Ua: NEGATIVE
Nitrite: NEGATIVE
Protein, ur: NEGATIVE mg/dL
Specific Gravity, Urine: 1.008 (ref 1.005–1.030)
pH: 6 (ref 5.0–8.0)

## 2019-10-12 LAB — PREGNANCY, URINE: Preg Test, Ur: NEGATIVE

## 2019-10-12 MED ORDER — TRAMADOL HCL 50 MG PO TABS
50.0000 mg | ORAL_TABLET | Freq: Once | ORAL | Status: AC
  Administered 2019-10-12: 15:00:00 50 mg via ORAL
  Filled 2019-10-12: qty 1

## 2019-10-12 MED ORDER — TRAMADOL HCL 50 MG PO TABS: 50.0000 mg | ORAL_TABLET | Freq: Four times a day (QID) | ORAL | 0 refills | Status: DC | PRN

## 2019-10-12 MED ORDER — MELOXICAM 15 MG PO TABS: 15.0000 mg | ORAL_TABLET | Freq: Every day | ORAL | 2 refills | Status: DC

## 2019-10-12 NOTE — ED Notes (Signed)
See triage note Presents with pelvic pain  States pain has been there since she had a baby  No fever or urinary sxs

## 2019-10-12 NOTE — ED Triage Notes (Signed)
Here for pain to pubic bone area.  Has had problems with pain since having baby last year. Pt has seen PT for this in past and was given some exercises to do.  Pain has been worse since walking a lot and working.   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

## 2019-10-12 NOTE — Telephone Encounter (Signed)
Sharp, constant vaginal pain since Sunday. Reported she had this type of pain after birth of 79 month old and had to do therapy. Denies any unusual discharge/bleeding/foreign object. No fever. Able to void without difficulty. LBM yesterday was normal no diarrhea. "Hurts to walk". Ibuprofen helps but not completely. Due to Covid 19 diagnosis late November, unable to bring in office at this time. Needs in person exam, virtual not appropriate. Advised UC or ED at this time. Husband will take her now.   Reason for Disposition . [1] SEVERE pain AND [2] not improved 2 hours after pain medicine  Answer Assessment - Initial Assessment Questions 1. SYMPTOM: "What's the main symptom you're concerned about?" (e.g., pain, itching, dryness)     Vaginal pain. 2. LOCATION: "Where is the   located?" (e.g., inside/outside, left/right)     Inside the vagina 3. ONSET: "When did the  pain  start?"     Sunday 4. PAIN: "Is there any pain?" If so, ask: "How bad is it?" (Scale: 1-10; mild, moderate, severe)     Severe 10 on the scale 5. ITCHING: "Is there any itching?" If so, ask: "How bad is it?" (Scale: 1-10; mild, moderate, severe)     none 6. CAUSE: "What do you think is causing the discharge?" "Have you had the same problem before? What happened then?"     No unusual discharge7. OTHER SYMPTOMS: "Do you have any other symptoms?" (e.g., fever, itching, vaginal bleeding, pain with urination, injury to genital area, vaginal foreign body)     Denies all except for vaginal pain.8. PREGNANCY: "Is there any chance you are pregnant?" "When was your last menstrual period?"     Reported no.  Protocols used: VAGINAL [facility]

## 2019-10-12 NOTE — Telephone Encounter (Signed)
Called husband states at ER

## 2019-10-12 NOTE — Discharge Instructions (Signed)
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  Use the medication as prescribed.  Rest and ice will also helpl

## 2019-10-12 NOTE — ED Provider Notes (Signed)
[facility] Emergency Department Provider Note  ____________________________________________   First MD Initiated Contact with Patient 10/12/19 1303     (approximate)  I have reviewed the triage vital signs and the nursing notes.   HISTORY  Chief Complaint pelvic pain    HPI Julie Fry is a 44 y.o. female presents emergency department complaint of pelvic pain.  Patient points to the inguinal areas and pubic bone.  She denies any vaginal discharge.  No fever or chills.  Same symptoms after having her child.  She did go to physical therapy for a while.   She quit doing the exercises and the pain has returned.  No known injury.   Past Medical History:  Diagnosis Date  . Medical history non-contributory     Patient Active Problem List   Diagnosis Date Noted  . Encounter for induction of labor 04/12/2018  . Advanced maternal age in multigravida, second trimester   . Preventative health care 01/14/2017    Past Surgical History:  Procedure Laterality Date  . DILATION AND EVACUATION N/A 08/07/2015   Procedure: DILATATION AND EVACUATION;  Surgeon: Boykin Nearing, MD;  Location: [facility] ORS;  Service: Gynecology;  Laterality: N/A;    Prior to Admission medications   Medication Sig Start Date End Date Taking? Authorizing Provider  meloxicam (MOBIC) 15 MG tablet Take 1 tablet (15 mg total) by mouth daily. 10/12/19 10/11/20  Omari Koslosky, Linden Dolin, PA-C  traMADol (ULTRAM) 50 MG tablet Take 1 tablet (50 mg total) by mouth every 6 (six) hours as needed. 10/12/19   Versie Starks, PA-C    Allergies Patient has no known allergies.  Family History  Problem Relation Age of Onset  . Cancer Father     Social History Social History   Tobacco Use  . Smoking status: Never Smoker  . Smokeless tobacco: Never Used  Substance Use Topics  . Alcohol use: No    Alcohol/week: 1.0 standard drinks    Types: 1 Glasses of wine per week  . Drug use: No     Review of Systems  Constitutional: No fever/chills Eyes: No visual changes. ENT: No sore throat. Respiratory: Denies cough Cardiovascular: Denies chest pain Gastrointestinal: Denies abdominal pain Genitourinary: Negative for dysuria.  Positive for pelvic type pain Musculoskeletal: Negative for back pain. Skin: Negative for rash. Psychiatric: no mood changes,     ____________________________________________   PHYSICAL EXAM:  VITAL SIGNS: ED Triage Vitals  Enc Vitals Group     BP 10/12/19 1202 107/74     Pulse Rate 10/12/19 1202 63     Resp 10/12/19 1202 14     Temp 10/12/19 1202 98.1 F (36.7 C)     Temp Source 10/12/19 1202 Oral     SpO2 10/12/19 1202 100 %     Weight 10/12/19 1210 107 lb (48.5 kg)     Height 10/12/19 1210 4\' 11"  (1.499 m)     Head Circumference --      Peak Flow --      Pain Score 10/12/19 1209 10     Pain Loc --      Pain Edu? --      Excl. in [place]? --     Constitutional: Alert and oriented. Well appearing and in no acute distress. Eyes: Conjunctivae are normal.  Head: Atraumatic. Nose: No congestion/rhinnorhea. Mouth/Throat: Mucous membranes are moist.   Neck:  supple no lymphadenopathy noted Cardiovascular: Normal rate, regular rhythm. Respiratory: Normal respiratory effort.  No retractions,  Abd:  soft nontender bs normal all 4 quad GU: deferred Musculoskeletal: FROM all extremities, warm and well perfused, pubic bone and inguinal areas are tender to palpation Neurologic:  Normal speech and language.  Skin:  Skin is warm, dry and intact. No rash noted. Psychiatric: Mood and affect are normal. Speech and behavior are normal.  ____________________________________________   LABS (all labs ordered are listed, but only abnormal results are displayed)  Labs Reviewed  URINALYSIS, COMPLETE (UACMP) WITH MICROSCOPIC - Abnormal; Notable for the following components:      Result Value   Color, Urine STRAW (*)    APPearance CLEAR (*)    Hgb  urine dipstick SMALL (*)    Bacteria, UA FEW (*)    All other components within normal limits  PREGNANCY, URINE   ____________________________________________   ____________________________________________  RADIOLOGY  Pelvis x-ray shows osteitis condensa   ____________________________________________   PROCEDURES  Procedure(s) performed: No  Procedures    ____________________________________________   INITIAL IMPRESSION / ASSESSMENT AND PLAN / ED COURSE  Pertinent labs & imaging results that were available during my care of the patient were reviewed by me and considered in my medical decision making (see chart for details).   Patient is a 44 year old female presents emergency department complaints of pelvic pain.  See HPI  Physical exam shows the pubic bone to be tender, bladder is mildly tender, inguinal areas are tender  Urinalysis, pregnancy test, x-ray of the pelvis   UA is normal, POC pregnancy is negative, x-ray of pelvis shows osteitis condensans  Explained the findings to the patient.  She was given a prescription for meloxicam and tramadol.  Tramadol 1 p.o. while here in the ED.  A work note as requested.  She is requesting a week.  Explained to her if I give her a week off I cannot fill out FMLA forms.  She states she understands.  She is discharged stable condition.  Julie Fry was evaluated in Emergency Department on 10/12/2019 for the symptoms described in the history of present illness. She was evaluated in the context of the global COVID-19 pandemic, which necessitated consideration that the patient might be at risk for infection with the SARS-CoV-2 virus that causes COVID-19. Institutional protocols and algorithms that pertain to the evaluation of patients at risk for COVID-19 are in a state of rapid change based on information released by regulatory bodies including the CDC and federal and state organizations. These policies and algorithms were  followed during the patient's care in the ED.   As part of my medical decision making, I reviewed the following data within the [facility] notes reviewed and incorporated, Labs reviewed see above, Old chart reviewed, Radiograph reviewed x-ray of the pelvis shows osteitis condensa, Notes from prior ED visits and [place] Controlled Substance Database  ____________________________________________   FINAL CLINICAL IMPRESSION(S) / ED DIAGNOSES  Final diagnoses:  Osteitis condensans ilii      NEW MEDICATIONS STARTED DURING THIS VISIT:  Discharge Medication List as of 10/12/2019  2:43 PM    START taking these medications   Details  meloxicam (MOBIC) 15 MG tablet Take 1 tablet (15 mg total) by mouth daily., Starting Tue 10/12/2019, Until Wed 10/11/2020, Normal    traMADol (ULTRAM) 50 MG tablet Take 1 tablet (50 mg total) by mouth every 6 (six) hours as needed., Starting Tue 10/12/2019, Normal         Note:  This document was prepared using Dragon voice recognition software and may include unintentional  dictation errors.    Versie Starks, PA-C 10/12/19 1512    Lilia Pro., MD 10/12/19 1850

## 2020-01-03 ENCOUNTER — Other Ambulatory Visit: Payer: Self-pay

## 2020-01-03 ENCOUNTER — Encounter: Payer: Self-pay | Admitting: Family Medicine

## 2020-01-03 ENCOUNTER — Ambulatory Visit: Payer: BLUE CROSS/BLUE SHIELD | Admitting: Family Medicine

## 2020-01-03 VITALS — BP 116/64 | HR 80 | Temp 98.3°F | Resp 14 | Ht 59.0 in | Wt 110.9 lb

## 2020-01-03 DIAGNOSIS — Z0000 Encounter for general adult medical examination without abnormal findings: Secondary | ICD-10-CM

## 2020-01-03 DIAGNOSIS — R102 Pelvic and perineal pain: Secondary | ICD-10-CM

## 2020-01-03 DIAGNOSIS — M8538 Osteitis condensans, other site: Secondary | ICD-10-CM | POA: Diagnosis not present

## 2020-01-03 DIAGNOSIS — Z1231 Encounter for screening mammogram for malignant neoplasm of breast: Secondary | ICD-10-CM

## 2020-01-03 NOTE — Progress Notes (Signed)
Patient: Julie Fry, Female    DOB: 1976/01/15, 44 y.o.   MRN: [id number] Delsa Grana, PA-C Visit Date: 01/03/2020  Today's Provider: Delsa Grana, PA-C   Chief Complaint  Patient presents with  . Annual Exam   Subjective:   Annual physical exam:  Julie Fry is a 44 y.o. female who presents today for complete physical exam:  She is here with family member who helps translate for her Exercise/Activity:   Recent injury?  Pelvic pain/osteitis condensansi ilii, July 2019,  Diet/nutrition:  Generally doesn't eat much beef, eats more chicken pork seafood, veggies  Sleep:  Sleeps good  She has labs with her done in Feb 2021 A1C, TSH, lipid panel CBC and CMP are all normal, trigs mildly elevated but she was not fasting  Right hip/pelvis SI joint pain for the past 1-2 years   USPSTF grade A and B recommendations - reviewed and addressed today  Depression:  Phq 9 completed today by patient, was reviewed by me with patient in the room PHQ score is neg, pt feels good PHQ 2/9 Scores 01/03/2020 09/07/2018 01/14/2017  PHQ - 2 Score 0 0 0  PHQ- 9 Score 0 0 -   Depression screen [facility] 2/9 01/03/2020 09/07/2018 01/14/2017  Decreased Interest 0 0 0  Down, Depressed, Hopeless 0 0 0  PHQ - 2 Score 0 0 0  Altered sleeping 0 0 -  Tired, decreased energy 0 0 -  Change in appetite 0 0 -  Feeling bad or failure about yourself  0 0 -  Trouble concentrating 0 0 -  Moving slowly or fidgety/restless 0 0 -  Suicidal thoughts 0 0 -  PHQ-9 Score 0 0 -  Difficult doing work/chores Not difficult at all Not difficult at all -    Alcohol screening:   Office Visit from 01/03/2020 in [facility]  AUDIT-C Score  0      Immunizations and Health Maintenance: Health Maintenance  Topic Date Due  . MAMMOGRAM  Never done  . PAP SMEAR-Modifier  01/22/2020  . INFLUENZA VACCINE  04/23/2020  . TETANUS/TDAP  09/04/2020  . HIV Screening  Completed     Hep C Screening:  n/a for age   STD testing and prevention (HIV/chl/gon/syphilis):  Comprehensively done in 2019 with pregnancy, HIV neg  Intimate partner violence:   Feels safe   Sexual History/Pain during Intercourse: Single  Menstrual History/LMP/Abnormal Bleeding: none  Patient's last menstrual period was 10/20/2019.  Incontinence Symptoms: none  Breast cancer:  Last Mammogram: see HM list above - never done, pregnancy in 2018 and 2019, no family hx, pt doesn't really want to do  BRCA gene screening: none  Cervical cancer screening: 2018-2019?   Pt denies family hx of cancers - breast, ovarian, uterine, colon:     Osteoporosis:   Discussion on osteoporosis per age, including high calcium and vitamin D supplementation, weight bearing exercises Pt is notsupplementing with daily calcium/Vit D.  Skin cancer:  Hx of skin CA -  NO, no currently concerns Discussed atypical lesions   Colorectal cancer:   Colonoscopy is due in 2 years (44 y/o) Discussed concerning signs and sx of CRC, pt denies melena, BM changes, blood in stool  Lung cancer:   Low Dose CT Chest recommended if Age 61-80 years, 30 pack-year currently smoking OR have quit w/in 15years. Patient does not qualify.    Social History   Tobacco Use  . Smoking status: Never Smoker  . Smokeless tobacco: Never  Used  Substance Use Topics  . Alcohol use: No    Alcohol/week: 1.0 standard drinks    Types: 1 Glasses of wine per week  . Drug use: No       [facility] Visit from 01/03/2020 in [facility]  AUDIT-C Score  0      Family History  Problem Relation Age of Onset  . Cancer Father      Blood pressure/Hypertension: BP Readings from Last 3 Encounters:  01/03/20 116/64  10/12/19 107/74  09/07/18 122/78    Weight/Obesity: Wt Readings from Last 3 Encounters:  01/03/20 110 lb 14.4 oz (50.3 kg)  10/12/19 107 lb (48.5 kg)  09/07/18 113 lb 3.2 oz (51.3 kg)   BMI Readings from Last 3 Encounters:  01/03/20  22.40 kg/m  10/12/19 21.61 kg/m  09/07/18 22.86 kg/m     Lipids:  No results found for: CHOL No results found for: HDL No results found for: LDLCALC No results found for: TRIG No results found for: CHOLHDL No results found for: LDLDIRECT Based on the results of lipid panel his/her cardiovascular risk factor ( using [place] )  in the next 10 years is: The ASCVD Risk score Mikey Bussing DC Jr., et al., 2013) failed to calculate for the following reasons:   Cannot find a previous HDL lab   Cannot find a previous total cholesterol lab Glucose:  Glucose, Bld  Date Value Ref Range Status  08/07/2015 97 65 - 99 mg/dL Final   Hypertension: BP Readings from Last 3 Encounters:  01/03/20 116/64  10/12/19 107/74  09/07/18 122/78   Obesity: Wt Readings from Last 3 Encounters:  01/03/20 110 lb 14.4 oz (50.3 kg)  10/12/19 107 lb (48.5 kg)  09/07/18 113 lb 3.2 oz (51.3 kg)   BMI Readings from Last 3 Encounters:  01/03/20 22.40 kg/m  10/12/19 21.61 kg/m  09/07/18 22.86 kg/m      Advanced Care Planning:  A voluntary discussion about advance care planning including the explanation and discussion of advance directives.   Discussed health care proxy and Living will, and the patient was able to identify a health care proxy as - not able to designate.  Social History      She        Social History   Socioeconomic History  . Marital status: Single    Spouse name: Not on file  . Number of children: Not on file  . Years of education: Not on file  . Highest education level: Not on file  Occupational History  . Not on file  Tobacco Use  . Smoking status: Never Smoker  . Smokeless tobacco: Never Used  Substance and Sexual Activity  . Alcohol use: No    Alcohol/week: 1.0 standard drinks    Types: 1 Glasses of wine per week  . Drug use: No  . Sexual activity: Yes    Partners: Male  Other Topics Concern  . Not on file  Social History Narrative  . Not on file   Social  Determinants of Health   Financial Resource Strain:   . Difficulty of Paying Living Expenses:   Food Insecurity:   . Worried About Charity fundraiser in the Last Year:   . Arboriculturist in the Last Year:   Transportation Needs:   . Film/video editor (Medical):   Marland Kitchen Lack of Transportation (Non-Medical):   Physical Activity:   . Days of Exercise per Week:   . Minutes of Exercise  per Session:   Stress:   . Feeling of Stress :   Social Connections:   . Frequency of Communication with Friends and Family:   . Frequency of Social Gatherings with Friends and Family:   . Attends Religious Services:   . Active Member of Clubs or Organizations:   . Attends Archivist Meetings:   Marland Kitchen Marital Status:     Family History        Family History  Problem Relation Age of Onset  . Cancer Father     Patient Active Problem List   Diagnosis Date Noted  . Encounter for induction of labor 04/12/2018  . Advanced maternal age in multigravida, second trimester   . Preventative health care 01/14/2017    Past Surgical History:  Procedure Laterality Date  . DILATION AND EVACUATION N/A 08/07/2015   Procedure: DILATATION AND EVACUATION;  Surgeon: Boykin Nearing, MD;  Location: [facility] ORS;  Service: Gynecology;  Laterality: N/A;     Current Outpatient Medications:  .  meloxicam (MOBIC) 15 MG tablet, Take 1 tablet (15 mg total) by mouth daily., Disp: 30 tablet, Rfl: 2  No Known Allergies  Patient Care Team: Delsa Grana, PA-C as PCP - General (Family Medicine)  Review of Systems  Constitutional: Negative.  Negative for activity change, appetite change, fatigue and unexpected weight change.  HENT: Negative.   Eyes: Negative.   Respiratory: Negative.  Negative for shortness of breath.   Cardiovascular: Negative.  Negative for chest pain, palpitations and leg swelling.  Gastrointestinal: Negative.  Negative for abdominal pain and blood in stool.  Endocrine: Negative.     Genitourinary: Negative.   Musculoskeletal: Negative.  Negative for arthralgias, gait problem, joint swelling and myalgias.  Skin: Negative.  Negative for color change, pallor and rash.  Allergic/Immunologic: Negative.   Neurological: Negative.  Negative for syncope and weakness.  Hematological: Negative.   Psychiatric/Behavioral: Negative.  Negative for confusion, dysphoric mood, self-injury and suicidal ideas. The patient is not nervous/anxious.        I personally reviewed active problem list, medication list, allergies, family history, social history, health maintenance, notes from last encounter, lab results, imaging with the patient/caregiver today.        Objective:   Vitals:  Vitals:   01/03/20 0930  BP: 116/64  Pulse: 80  Resp: 14  Temp: 98.3 F (36.8 C)  SpO2: 99%  Weight: 110 lb 14.4 oz (50.3 kg)  Height: 4' 11" (1.499 m)    Body mass index is 22.4 kg/m.  Physical Exam Vitals and nursing note reviewed. Exam conducted with a chaperone present.  Constitutional:      General: She is not in acute distress.    Appearance: Normal appearance. She is well-developed. She is not ill-appearing, toxic-appearing or diaphoretic.     Interventions: Face mask in place.  HENT:     Head: Normocephalic and atraumatic.     Right Ear: External ear normal.     Left Ear: External ear normal.  Eyes:     General: Lids are normal. No scleral icterus.       Right eye: No discharge.        Left eye: No discharge.     Conjunctiva/sclera: Conjunctivae normal.  Neck:     Trachea: Phonation normal. No tracheal deviation.  Cardiovascular:     Rate and Rhythm: Normal rate and regular rhythm.     Pulses: Normal pulses.          Radial pulses are  2+ on the right side and 2+ on the left side.       Posterior tibial pulses are 2+ on the right side and 2+ on the left side.     Heart sounds: Normal heart sounds. No murmur. No friction rub. No gallop.   Pulmonary:     Effort: Pulmonary  effort is normal. No respiratory distress.     Breath sounds: Normal breath sounds. No stridor. No wheezing, rhonchi or rales.  Chest:     Chest wall: No tenderness.     Breasts: Breasts are symmetrical.        Right: Normal. No swelling, bleeding, inverted nipple, mass, nipple discharge, skin change or tenderness.        Left: Normal. No swelling, bleeding, inverted nipple, mass, nipple discharge, skin change or tenderness.  Abdominal:     General: Bowel sounds are normal. There is no distension.     Palpations: Abdomen is soft.     Tenderness: There is no abdominal tenderness. There is no guarding or rebound.  Musculoskeletal:        General: No deformity.     Cervical back: Normal range of motion and neck supple.     Right lower leg: No edema.     Left lower leg: No edema.  Lymphadenopathy:     Cervical: No cervical adenopathy.     Upper Body:     Right upper body: No supraclavicular, axillary or pectoral adenopathy.     Left upper body: No supraclavicular, axillary or pectoral adenopathy.  Skin:    General: Skin is warm and dry.     Capillary Refill: Capillary refill takes less than 2 seconds.     Coloration: Skin is not jaundiced or pale.     Findings: No rash.  Neurological:     Mental Status: She is alert and oriented to person, place, and time.     Motor: No abnormal muscle tone.     Gait: Gait abnormal.  Psychiatric:        Mood and Affect: Mood normal.        Speech: Speech normal.        Behavior: Behavior normal.       Fall Risk: Fall Risk  01/03/2020 09/07/2018 11/13/2017 01/14/2017  Falls in the past year? 0 0 No No  Number falls in past yr: 0 - - -  Injury with Fall? 0 - - -    Functional Status Survey: Is the patient deaf or have difficulty hearing?: Yes Does the patient have difficulty seeing, even when wearing glasses/contacts?: Yes Does the patient have difficulty concentrating, remembering, or making decisions?: No Does the patient have difficulty  walking or climbing stairs?: No Does the patient have difficulty dressing or bathing?: No Does the patient have difficulty doing errands alone such as visiting a doctor's office or shopping?: No   Assessment & Plan:    CPE completed today  . USPSTF grade A and B recommendations reviewed with patient; age-appropriate recommendations, preventive care, screening tests, etc discussed and encouraged; healthy living encouraged; see AVS for patient education given to patient  . Discussed importance of 150 minutes of physical activity weekly, AHA exercise recommendations given to pt in AVS/handout  . Discussed importance of healthy diet:  eating lean meats and proteins, avoiding trans fats and saturated fats, avoid simple sugars and excessive carbs in diet, eat 6 servings of fruit/vegetables daily and drink plenty of water and avoid sweet beverages.    . Recommended  pt to do annual eye exam and routine dental exams/cleanings  . Depression, alcohol, fall screening completed as documented above and per flowsheets  . Reviewed Health Maintenance: Health Maintenance  Topic Date Due  . MAMMOGRAM  Never done  . PAP SMEAR-Modifier  01/22/2020  . INFLUENZA VACCINE  04/23/2020  . TETANUS/TDAP  09/04/2020  . HIV Screening  Completed   Pt wishes to defer PAP until next year, did breast exam today, no abnormal findings, mammogram ordered but pt states she is reluctant to do - she was given [place] [facility] contact info - encouraged to think about it and call if she would like to schedule  Labs brought in by pt that were completed 10/2019 were reviewed by me in the room and discussed with the pt and boyfriend.  No abnormal findings that need to be f/up on, lipid screen normal. Labs will be scanned into chart.    ICD-10-CM   1. Adult general medical exam  Z00.00    labs recently done, reviewed today, pap due in the next month, pt defers to next year, mammogram ordered  2. 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 Pelvic pain  R10.2    since 03/2018 after delivery separation of pubic symphysis - declines any PT or referrals today, visibly uncomfortable         Delsa Grana, PA-C 01/03/20 9:51 AM  [facility]

## 2020-01-03 NOTE — Patient Instructions (Addendum)
Call the [facility] if you would like to schedule your mammogram  Let me know if you would like to go back to physical therapy for your hips/pelvis/back pain  Due for your PAP in the next year  Health Maintenance  Topic Date Due  . MAMMOGRAM  Never done  . PAP SMEAR-Modifier  01/22/2020  . INFLUENZA VACCINE  04/23/2020  . TETANUS/TDAP  09/04/2020  . HIV Screening  Completed     Preventive Care 61-44 Years Old, Female Preventive care refers to visits with your health care provider and lifestyle choices that can promote health and wellness. This includes:  A yearly physical exam. This may also be called an annual well check.  Regular dental visits and eye exams.  Immunizations.  Screening for certain conditions.  Healthy lifestyle choices, such as eating a healthy diet, getting regular exercise, not using drugs or products that contain nicotine and tobacco, and limiting alcohol use. What can I expect for my preventive care visit? Physical exam Your health care provider will check your:  Height and weight. This may be used to calculate body mass index (BMI), which tells if you are at a healthy weight.  Heart rate and blood pressure.  Skin for abnormal spots. Counseling Your health care provider may ask you questions about your:  Alcohol, tobacco, and drug use.  Emotional well-being.  Home and relationship well-being.  Sexual activity.  Eating habits.  Work and work Statistician.  Method of birth control.  Menstrual cycle.  Pregnancy history. What immunizations do I need?  Influenza (flu) vaccine  This is recommended every year. Tetanus, diphtheria, and pertussis (Tdap) vaccine  You may need a Td booster every 10 years. Varicella (chickenpox) vaccine  You may need this if you have not been vaccinated. Zoster (shingles) vaccine  You may need this after age 61. Measles, mumps, and rubella (MMR) vaccine  You may need at least one dose of MMR if you  were born in 1957 or later. You may also need a second dose. Pneumococcal conjugate (PCV13) vaccine  You may need this if you have certain conditions and were not previously vaccinated. Pneumococcal polysaccharide (PPSV23) vaccine  You may need one or two doses if you smoke cigarettes or if you have certain conditions. Meningococcal conjugate (MenACWY) vaccine  You may need this if you have certain conditions. Hepatitis A vaccine  You may need this if you have certain conditions or if you travel or work in places where you may be exposed to hepatitis A. Hepatitis B vaccine  You may need this if you have certain conditions or if you travel or work in places where you may be exposed to hepatitis B. Haemophilus influenzae type b (Hib) vaccine  You may need this if you have certain conditions. Human papillomavirus (HPV) vaccine  If recommended by your health care provider, you may need three doses over 6 months. You may receive vaccines as individual doses or as more than one vaccine together in one shot (combination vaccines). Talk with your health care provider about the risks and benefits of combination vaccines. What tests do I need? Blood tests  Lipid and cholesterol levels. These may be checked every 5 years, or more frequently if you are over 65 years old.  Hepatitis C test.  Hepatitis B test. Screening  Lung cancer screening. You may have this screening every year starting at age 98 if you have a 30-pack-year history of smoking and currently smoke or have quit within the past  15 years.  Colorectal cancer screening. All adults should have this screening starting at age 1 and continuing until age 32. Your health care provider may recommend screening at age 44 if you are at increased risk. You will have tests every 1-10 years, depending on your results and the type of screening test.  Diabetes screening. This is done by checking your blood sugar (glucose) after you have not  eaten for a while (fasting). You may have this done every 1-3 years.  Mammogram. This may be done every 1-2 years. Talk with your health care provider about when you should start having regular mammograms. This may depend on whether you have a family history of breast cancer.  BRCA-related cancer screening. This may be done if you have a family history of breast, ovarian, tubal, or peritoneal cancers.  Pelvic exam and Pap test. This may be done every 3 years starting at age 81. Starting at age 32, this may be done every 5 years if you have a Pap test in combination with an HPV test. Other tests  Sexually transmitted disease (STD) testing.  Bone density scan. This is done to screen for osteoporosis. You may have this scan if you are at high risk for osteoporosis. Follow these instructions at home: Eating and drinking  Eat a diet that includes fresh fruits and vegetables, whole grains, lean protein, and low-fat dairy.  Take vitamin and mineral supplements as recommended by your health care provider.  Do not drink alcohol if: ? Your health care provider tells you not to drink. ? You are pregnant, may be pregnant, or are planning to become pregnant.  If you drink alcohol: ? Limit how much you have to 0-1 drink a day. ? Be aware of how much alcohol is in your drink. In the [place] one drink equals one 12 oz bottle of beer (355 mL), one 5 oz glass of wine (148 mL), or one 1 oz glass of hard liquor (44 mL). Lifestyle  Take daily care of your teeth and gums.  Stay active. Exercise for at least 30 minutes on 5 or more days each week.  Do not use any products that contain nicotine or tobacco, such as cigarettes, e-cigarettes, and chewing tobacco. If you need help quitting, ask your health care provider.  If you are sexually active, practice safe sex. Use a condom or other form of birth control (contraception) in order to prevent pregnancy and STIs (sexually transmitted infections).  If told  by your health care provider, take low-dose aspirin daily starting at age 77. What's next?  Visit your health care provider once a year for a well check visit.  Ask your health care provider how often you should have your eyes and teeth checked.  Stay up to date on all vaccines. This information is not intended to replace advice given to you by your health care provider. Make sure you discuss any questions you have with your health care provider. Document Revised: 05/21/2018 Document Reviewed: 05/21/2018 Elsevier Patient Education  2020 [employer].

## 2020-05-28 ENCOUNTER — Emergency Department
Admission: EM | Admit: 2020-05-28 | Discharge: 2020-05-28 | Disposition: A | Discharge status: Home / Self Care | Payer: Worker's Compensation | Attending: Emergency Medicine | Admitting: Emergency Medicine

## 2020-05-28 ENCOUNTER — Other Ambulatory Visit: Payer: Self-pay

## 2020-05-28 DIAGNOSIS — Z79899 Other long term (current) drug therapy: Secondary | ICD-10-CM | POA: Insufficient documentation

## 2020-05-28 DIAGNOSIS — X509XXA Other and unspecified overexertion or strenuous movements or postures, initial encounter: Secondary | ICD-10-CM | POA: Diagnosis not present

## 2020-05-28 DIAGNOSIS — Y939 Activity, unspecified: Secondary | ICD-10-CM | POA: Diagnosis not present

## 2020-05-28 DIAGNOSIS — Z23 Encounter for immunization: Secondary | ICD-10-CM | POA: Diagnosis not present

## 2020-05-28 DIAGNOSIS — Y999 Unspecified external cause status: Secondary | ICD-10-CM | POA: Diagnosis not present

## 2020-05-28 DIAGNOSIS — Y9289 Other specified places as the place of occurrence of the external cause: Secondary | ICD-10-CM | POA: Insufficient documentation

## 2020-05-28 DIAGNOSIS — S0993XA Unspecified injury of face, initial encounter: Secondary | ICD-10-CM

## 2020-05-28 DIAGNOSIS — S00511A Abrasion of lip, initial encounter: Secondary | ICD-10-CM | POA: Diagnosis not present

## 2020-05-28 MED ORDER — ACETAMINOPHEN 325 MG PO TABS
650.0000 mg | ORAL_TABLET | Freq: Once | ORAL | Status: AC
Start: 2020-05-28 — End: 2020-05-28
  Administered 2020-05-28: 650 mg via ORAL
  Filled 2020-05-28: qty 2

## 2020-05-28 MED ORDER — TETANUS-DIPHTH-ACELL PERTUSSIS 5-2.5-18.5 LF-MCG/0.5 IM SUSP
0.5000 mL | Freq: Once | INTRAMUSCULAR | Status: AC
  Administered 2020-05-28: 0.5 mL via INTRAMUSCULAR
  Filled 2020-05-28: qty 0.5

## 2020-05-28 NOTE — ED Triage Notes (Signed)
Patient arrived via POV. Patient is AOx4 and ambulatory. Patient speaks Lao's and has husband at side. Patient was at work and had a spool of yarn popped off of the machine and hit patient in face. No oral trauma noted however patient does have 2-3 small lacerations on lip and left upper gums are red and swollen. Patient stated she was stunned but did not loose consciousness, no blurry vision, headache, or neck and back pain.

## 2020-05-28 NOTE — Discharge Instructions (Signed)
Please use Tylenol 3 4 to 6 hours As needed for pain.  Please return to the emergency department for any worsening of symptoms.

## 2020-05-28 NOTE — ED Provider Notes (Signed)
[facility] Provider Note  ____________________________________________   First MD Initiated Contact with Patient 05/28/20 1809     (approximate)  I have reviewed the triage vital signs and the nursing notes.   HISTORY  Chief Complaint Facial Injury and Workers Comp   HPI Julie Fry is a 44 y.o. female the emergency department after an injury occurred at work.  The patient works at a [employer] and a spool of thread on the machine got full and popped off of the machine hitting the left side of her mouth.  The spool that hit her was metal in nature.  She did not lose consciousness during the injury, denies headache, dizziness.  The patient has 2 small abrasions to the left upper and lower lips and had some mild pain at her upper gums on the left side.  Due to the nature of this being a work incident, they required her to be evaluated here.  The patient denies any dental pain, denies shifting of teeth.  Has not had a Tdap in the last 10 years.         Past Medical History:  Diagnosis Date  . Medical history non-contributory     Patient Active Problem List   Diagnosis Date Noted  . Encounter for induction of labor 04/12/2018  . Advanced maternal age in multigravida, second trimester   . Preventative health care 01/14/2017    Past Surgical History:  Procedure Laterality Date  . DILATION AND EVACUATION N/A 08/07/2015   Procedure: DILATATION AND EVACUATION;  Surgeon: Boykin Nearing, MD;  Location: [facility] ORS;  Service: Gynecology;  Laterality: N/A;    Prior to Admission medications   Medication Sig Start Date End Date Taking? Authorizing Provider  meloxicam (MOBIC) 15 MG tablet Take 1 tablet (15 mg total) by mouth daily. 10/12/19 10/11/20  Versie Starks, PA-C    Allergies Patient has no known allergies.  Family History  Problem Relation Age of Onset  . Cancer Father     Social History Social History   Tobacco  Use  . Smoking status: Never Smoker  . Smokeless tobacco: Never Used  Vaping Use  . Vaping Use: Never used  Substance Use Topics  . Alcohol use: No    Alcohol/week: 1.0 standard drink    Types: 1 Glasses of wine per week  . Drug use: No    Review of Systems Constitutional: No fever/chills Eyes: No visual changes. ENT: No sore throat. + Mouth injury Cardiovascular: Denies chest pain. Respiratory: Denies shortness of breath. Gastrointestinal: No abdominal pain.  No nausea, no vomiting.  No diarrhea.  No constipation. Genitourinary: Negative for dysuria. Musculoskeletal: Negative for back pain. Skin: Negative for rash. Neurological: Negative for headaches, focal weakness or numbness.  ____________________________________________   PHYSICAL EXAM:  VITAL SIGNS: ED Triage Vitals  Enc Vitals Group     BP 05/28/20 1752 135/68     Pulse Rate 05/28/20 1752 71     Resp 05/28/20 1752 19     Temp 05/28/20 1752 98.4 F (36.9 C)     Temp Source 05/28/20 1752 Oral     SpO2 05/28/20 1752 99 %     Weight 05/28/20 1753 111 lb 15.9 oz (50.8 kg)     Height 05/28/20 1753 4\' 11"  (1.499 m)     Head Circumference --      Peak Flow --      Pain Score 05/28/20 1752 8     Pain Loc --  Pain Edu? --      Excl. in [place]? --     Constitutional: Alert and oriented. Well appearing and in no acute distress. Eyes: Conjunctivae are normal. PERRL. EOMI. Head: Atraumatic. Nose: No congestion/rhinnorhea. Mouth/Throat: There are 2 small abrasions, one on the upper and one on the lower lip.  These are just left of midline without active bleeding.  There is no lacerated area.  The teeth are all present and in their appropriate position.  There is evidence of gum injury.  Patient has no tenderness to palpation of the facial bones.  She is able to open and close her mandible without pain or difficulty.  Mucous membranes are moist.  Oropharynx non-erythematous. Neck: No stridor.   Cardiovascular: Normal  rate, regular rhythm. Grossly normal heart sounds.  Good peripheral circulation. Respiratory: Normal respiratory effort.  No retractions. Lungs CTAB. Gastrointestinal: Soft and nontender. No distention.  Musculoskeletal: No lower extremity tenderness nor edema.  No joint effusions. Neurologic:  Normal speech and language. No gross focal neurologic deficits are appreciated. No gait instability. Skin:  Skin is warm, dry and intact. No rash noted. Psychiatric: Mood and affect are normal. Speech and behavior are normal.    ____________________________________________   INITIAL IMPRESSION / ASSESSMENT AND PLAN / ED COURSE  As part of my medical decision making, I reviewed the following data within the [place] notes reviewed and incorporated        Julie Fry is a 44 year old female who presents to the emergency department after a work injury where a spool of thread hit her in the face.  She does have 2 small abrasions 1 to the upper and 1 to the lower lip that are just left of midline.  There is no evidence of injury to the underlying teeth, and gums or jaw.  The patient denies any headache, loss of consciousness, dizziness or signs of other intracranial injury.  Differentials considered for this patient include lip laceration, tooth injury, facial bone fracture, intracranial injury.  Given the low mechanism of trauma as well as the lack of symptoms patient is experiencing at this time as well as a reassuring physical exam, it is unlikely that a CT of the maxillofacial bones would reveal any abnormal findings.   The nature of these abrasions is such that there is no repairable laceration.  It was recommended that the patient could use Vaseline or Aquaphor as a barrier protectant.  The patient should return to the emergency room for any worsening pain or difficulties.  Julie Fry was evaluated in Emergency Department on 05/28/2020 for the symptoms  described in the history of present illness. She was evaluated in the context of the global COVID-19 pandemic, which necessitated consideration that the patient might be at risk for infection with the SARS-CoV-2 virus that causes COVID-19. Institutional protocols and algorithms that pertain to the evaluation of patients at risk for COVID-19 are in a state of rapid change based on information released by regulatory bodies including the CDC and federal and state organizations. These policies and algorithms were followed during the patient's care in the ED.       ____________________________________________   FINAL CLINICAL IMPRESSION(S) / ED DIAGNOSES  Final diagnoses:  Facial trauma, initial encounter     ED Discharge Orders    None       Note:  This document was prepared using Dragon voice recognition software and may include unintentional dictation errors.    Marlana Salvage, PA  05/28/20 [place], MD 05/28/20 2320

## 2020-05-30 ENCOUNTER — Encounter: Payer: Self-pay | Admitting: Internal Medicine

## 2020-05-30 ENCOUNTER — Other Ambulatory Visit: Payer: Self-pay

## 2020-05-30 ENCOUNTER — Ambulatory Visit (INDEPENDENT_AMBULATORY_CARE_PROVIDER_SITE_OTHER): Payer: BC Managed Care – PPO | Admitting: Internal Medicine

## 2020-05-30 VITALS — BP 114/70 | HR 80 | Temp 98.1°F | Resp 16 | Ht 59.0 in | Wt 110.2 lb

## 2020-05-30 DIAGNOSIS — K648 Other hemorrhoids: Secondary | ICD-10-CM | POA: Diagnosis not present

## 2020-05-30 NOTE — Patient Instructions (Signed)
A referral was placed to gastroenterology today

## 2020-05-30 NOTE — Progress Notes (Signed)
Patient ID: Julie Fry, female    DOB: 1976/04/23, 44 y.o.   MRN: [id number]  PCP: Delsa Grana, PA-C  Chief Complaint  Patient presents with  . Hemorrhoids    when she uses the bathroom she has to push the hemorrhoid back it, it is bothersome, discuss treatment options    Subjective:   Julie Fry is a 44 y.o. female, presents to clinic with CC of the following:  Chief Complaint  Patient presents with  . Hemorrhoids    when she uses the bathroom she has to push the hemorrhoid back it, it is bothersome, discuss treatment options    HPI:  Patient is a 44 year old female patient of Delsa Grana Last visit with her was 01/03/2020 Follows up today with hemorrhoid concerns. Her boyfriend was with her today and acted as the interpreter.  She notes that since she had her baby 2 years ago, she has had issues that when she goes to the bathroom, often tissue prolapses out when has bowel movements, and she has to push it back in.  It is becoming more and more bothersome and uncomfortable.  Denies marked pain.  Also denies any bleeding per rectum. She would like to get this treated. She denies any abdominal pains, no black or dark stools.  Patient Active Problem List   Diagnosis Date Noted  . Encounter for induction of labor 04/12/2018  . Advanced maternal age in multigravida, second trimester   . Preventative health care 01/14/2017     No current outpatient medications on file.   No Known Allergies   Past Surgical History:  Procedure Laterality Date  . DILATION AND EVACUATION N/A 08/07/2015   Procedure: DILATATION AND EVACUATION;  Surgeon: Boykin Nearing, MD;  Location: [facility] ORS;  Service: Gynecology;  Laterality: N/A;     Family History  Problem Relation Age of Onset  . Cancer Father      Social History   Tobacco Use  . Smoking status: Never Smoker  . Smokeless tobacco: Never Used  Substance Use Topics  . Alcohol use: No    Alcohol/week:  1.0 standard drink    Types: 1 Glasses of wine per week    With staff assistance, above reviewed with the patient today.  ROS: As per HPI, otherwise no specific complaints on a limited and focused system review   No results found for this or any previous visit (from the past 72 hour(s)).   PHQ2/9: Depression screen [facility] 2/9 05/30/2020 01/03/2020 09/07/2018 01/14/2017  Decreased Interest 0 0 0 0  Down, Depressed, Hopeless 0 0 0 0  PHQ - 2 Score 0 0 0 0  Altered sleeping - 0 0 -  Tired, decreased energy - 0 0 -  Change in appetite - 0 0 -  Feeling bad or failure about yourself  - 0 0 -  Trouble concentrating - 0 0 -  Moving slowly or fidgety/restless - 0 0 -  Suicidal thoughts - 0 0 -  PHQ-9 Score - 0 0 -  Difficult doing work/chores - Not difficult at all Not difficult at all -   PHQ-2/9 Result is neg  Fall Risk: Fall Risk  05/30/2020 01/03/2020 09/07/2018 11/13/2017 01/14/2017  Falls in the past year? 0 0 0 No No  Number falls in past yr: 0 0 - - -  Injury with Fall? 0 0 - - -  Follow up Falls evaluation completed - - - -      Objective:   Vitals:  05/30/20 0852  BP: 114/70  Pulse: 80  Resp: 16  Temp: 98.1 F (36.7 C)  TempSrc: Oral  SpO2: 99%  Weight: 110 lb 3.2 oz (50 kg)  Height: 4\' 11"  (1.499 m)    Body mass index is 22.26 kg/m.  Physical Exam  Melissa present as a chaperone for the assessment NAD, masked, pleasant HEENT - [place]/AT, sclera anicteric, Abd - soft, NT, ND,  Rectal -prominent hemorrhoidal type tissue present in the rectal region, not thrombosed, not tender to palpate, no erythema,  Neuro/psychiatric - affect was not flat, appropriate with conversation  Alert   Results for orders placed or performed during the hospital encounter of 10/12/19  Urinalysis, Complete w Microscopic  Result Value Ref Range   Color, Urine STRAW (A) YELLOW   APPearance CLEAR (A) CLEAR   Specific Gravity, Urine 1.008 1.005 - 1.030   pH 6.0 5.0 - 8.0   Glucose, UA  NEGATIVE NEGATIVE mg/dL   Hgb urine dipstick SMALL (A) NEGATIVE   Bilirubin Urine NEGATIVE NEGATIVE   Ketones, ur NEGATIVE NEGATIVE mg/dL   Protein, ur NEGATIVE NEGATIVE mg/dL   Nitrite NEGATIVE NEGATIVE   Leukocytes,Ua NEGATIVE NEGATIVE   RBC / HPF 0-5 0 - 5 RBC/hpf   WBC, UA 0-5 0 - 5 WBC/hpf   Bacteria, UA FEW (A) NONE SEEN   Squamous Epithelial / LPF 6-10 0 - 5   Mucus PRESENT   Pregnancy, urine  Result Value Ref Range   Preg Test, Ur NEGATIVE NEGATIVE       Assessment & Plan:    1. Hemorrhoid prolapse Discussed with patient that it is encouraging that she is not having marked pain nor bleeding presently, and this is likely hemorrhoidal type tissue that is prolapsing.  She very much wanted to get this treated, and did want to see gastroenterology when offered that option. A referral was placed today. Await their input presently - Ambulatory referral to Gastroenterology       Towanda Malkin, MD 05/30/20 9:06 AM

## 2020-06-14 ENCOUNTER — Encounter: Payer: Self-pay | Admitting: *Deleted

## 2021-01-03 ENCOUNTER — Encounter: Payer: Medicaid Other | Admitting: Family Medicine

## 2021-01-04 ENCOUNTER — Other Ambulatory Visit (HOSPITAL_COMMUNITY)
Admission: RE | Admit: 2021-01-04 | Discharge: 2021-01-04 | Disposition: A | Discharge status: Home / Self Care | Payer: BC Managed Care – PPO | Source: Ambulatory Visit | Attending: Family Medicine | Admitting: Family Medicine

## 2021-01-04 ENCOUNTER — Ambulatory Visit (INDEPENDENT_AMBULATORY_CARE_PROVIDER_SITE_OTHER): Payer: BC Managed Care – PPO | Admitting: Family Medicine

## 2021-01-04 ENCOUNTER — Encounter: Payer: Self-pay | Admitting: Family Medicine

## 2021-01-04 ENCOUNTER — Other Ambulatory Visit: Payer: Self-pay

## 2021-01-04 VITALS — BP 110/72 | HR 92 | Temp 100.5°F | Resp 16 | Ht 59.0 in | Wt 109.2 lb

## 2021-01-04 DIAGNOSIS — Z1231 Encounter for screening mammogram for malignant neoplasm of breast: Secondary | ICD-10-CM

## 2021-01-04 DIAGNOSIS — Z30016 Encounter for initial prescription of transdermal patch hormonal contraceptive device: Secondary | ICD-10-CM

## 2021-01-04 DIAGNOSIS — Z124 Encounter for screening for malignant neoplasm of cervix: Secondary | ICD-10-CM | POA: Insufficient documentation

## 2021-01-04 DIAGNOSIS — Z0000 Encounter for general adult medical examination without abnormal findings: Secondary | ICD-10-CM | POA: Diagnosis not present

## 2021-01-04 DIAGNOSIS — K648 Other hemorrhoids: Secondary | ICD-10-CM | POA: Diagnosis not present

## 2021-01-04 DIAGNOSIS — R102 Pelvic and perineal pain: Secondary | ICD-10-CM

## 2021-01-04 DIAGNOSIS — Z113 Encounter for screening for infections with a predominantly sexual mode of transmission: Secondary | ICD-10-CM | POA: Diagnosis not present

## 2021-01-04 LAB — CBC WITH DIFFERENTIAL/PLATELET
Eosinophils Relative: 0.3 %
RBC: 4.51 10*6/uL (ref 3.80–5.10)
WBC: 17.7 10*3/uL — ABNORMAL HIGH (ref 3.8–10.8)

## 2021-01-04 MED ORDER — NORELGESTROMIN-ETH ESTRADIOL 150-35 MCG/24HR TD PTWK
1.0000 | MEDICATED_PATCH | TRANSDERMAL | 12 refills | Status: DC
Start: 2021-01-04 — End: 2021-03-12

## 2021-01-04 MED ORDER — MELOXICAM 15 MG PO TABS: 1.0000 | ORAL_TABLET | Freq: Every day | ORAL | 1 refills | Status: DC

## 2021-01-04 MED ORDER — HYDROCORTISONE ACETATE 25 MG RE SUPP
25.0000 mg | Freq: Two times a day (BID) | RECTAL | 0 refills | Status: DC
Start: 2021-01-04 — End: 2021-06-06

## 2021-01-04 MED ORDER — PREPARATION H 0.25-50 % EX GEL: 1.0000 "application " | Freq: Two times a day (BID) | CUTANEOUS | 2 refills | Status: AC

## 2021-01-04 NOTE — Progress Notes (Signed)
4/14/202211:08 AM  Julie Fry [birth year], 45 y.o., female [id number]  Chief Complaint  Patient presents with  . Annual Exam    HPI:   Patient is a 45 y.o. female with no significnat past medical history significant who presents today for cpe  Lives with BF, he is translating for her today She declined interpreter services  Had a baby 2019 Vaginal Delivery, had some issues but unable to verbalize Had to see a chiropractor post delivery since was unable to walk Still having intermittent pelvic pain  Mammogram: Order Placed, needs to schedule Pap: Last 06/2015, nilm Will do pap smear today  Dentist: Last went 2 years ago Eye doctor: Never Diet: Doesn't follow a necessarily healthy diet Exercise: never Having regular periods Would like to be on the patch for contraception   Health Maintenance  Topic Date Due  . MAMMOGRAM  Never done  . PAP SMEAR-Modifier  01/22/2020  . COVID-19 Vaccine (1) 01/20/2021 (Originally 05/02/1981)  . Hepatitis C Screening  05/30/2021 (Originally 1976/08/25)  . INFLUENZA VACCINE  04/23/2021  . TETANUS/TDAP  05/28/2030  . HIV Screening  Completed  . HPV VACCINES  Aged Out    Depression screen [facility] 2/9 01/04/2021 05/30/2020 01/03/2020  Decreased Interest 0 0 0  Down, Depressed, Hopeless 0 0 0  PHQ - 2 Score 0 0 0  Altered sleeping - - 0  Tired, decreased energy - - 0  Change in appetite - - 0  Feeling bad or failure about yourself  - - 0  Trouble concentrating - - 0  Moving slowly or fidgety/restless - - 0  Suicidal thoughts - - 0  PHQ-9 Score - - 0  Difficult doing work/chores - - Not difficult at all    Fall Risk  01/04/2021 05/30/2020 01/03/2020 09/07/2018 11/13/2017  Falls in the past year? 0 0 0 0 No  Number falls in past yr: 0 0 0 - -  Injury with Fall? 0 0 0 - -  Follow up Falls evaluation completed Falls evaluation completed - - -     No Known Allergies  Prior to Admission medications   Medication Sig Start Date End  Date Taking? Authorizing Provider  meloxicam (MOBIC) 15 MG tablet Take 1 tablet by mouth daily. 07/12/20   [provider]    Past Medical History:  Diagnosis Date  . Medical history non-contributory     Past Surgical History:  Procedure Laterality Date  . DILATION AND EVACUATION N/A 08/07/2015   Procedure: DILATATION AND EVACUATION;  Surgeon: Boykin Nearing, MD;  Location: [facility] ORS;  Service: Gynecology;  Laterality: N/A;    Social History   Tobacco Use  . Smoking status: Never Smoker  . Smokeless tobacco: Never Used  Substance Use Topics  . Alcohol use: No    Alcohol/week: 1.0 standard drink    Types: 1 Glasses of wine per week    Family History  Problem Relation Age of Onset  . Cancer Father     Review of Systems  Constitutional: Negative for chills, fever and malaise/fatigue.  Eyes: Negative for blurred vision and double vision.  Respiratory: Negative for cough, shortness of breath and wheezing.   Cardiovascular: Negative for chest pain, palpitations and leg swelling.  Gastrointestinal: Positive for constipation. Negative for abdominal pain, blood in stool, diarrhea, heartburn, nausea and vomiting.       Hemorrhoids  Genitourinary: Negative for dysuria, flank pain, frequency, hematuria and urgency.  Musculoskeletal: Negative for back pain and joint pain.  Skin:  Negative for rash.  Neurological: Negative for dizziness, weakness and headaches.     OBJECTIVE:  Today's Vitals   01/04/21 1019  BP: 110/72  Pulse: 92  Resp: 16  Temp: (!) 100.5 F (38.1 C)  TempSrc: Oral  SpO2: 98%  Weight: 109 lb 3.2 oz (49.5 kg)  Height: 4\' 11"  (1.499 m)   Body mass index is 22.06 kg/m.   Physical Exam Vitals reviewed.  Constitutional:      Appearance: Normal appearance.  HENT:     Right Ear: Tympanic membrane and ear canal normal.     Left Ear: Tympanic membrane and ear canal normal.     Nose: Nose normal.  Eyes:     Extraocular Movements:  Extraocular movements intact.     Pupils: Pupils are equal, round, and reactive to light.  Neck:     Thyroid: No thyroid mass, thyromegaly or thyroid tenderness.  Cardiovascular:     Rate and Rhythm: Normal rate and regular rhythm.     Pulses: Normal pulses.     Heart sounds: Normal heart sounds.  Pulmonary:     Effort: Pulmonary effort is normal.     Breath sounds: Normal breath sounds.  Abdominal:     General: Bowel sounds are normal.     Palpations: Abdomen is soft.  Musculoskeletal:        General: Normal range of motion.     Cervical back: Normal range of motion.  Lymphadenopathy:     Cervical: No cervical adenopathy.  Skin:    General: Skin is warm and dry.     Capillary Refill: Capillary refill takes less than 2 seconds.  Neurological:     General: No focal deficit present.     Mental Status: She is alert and oriented to person, place, and time.  Psychiatric:        Mood and Affect: Mood normal.        Behavior: Behavior normal.   Pelvic exam: normal external genitalia, vulva, vagina, cervix, uterus and adnexa, VULVA: normal appearing vulva with no masses, tenderness or lesions, VAGINA: normal appearing vagina with normal color and discharge, no lesions, CERVIX: normal appearing cervix without discharge or lesions, UTERUS: uterus is normal size, shape, consistency and nontender, ADNEXA: normal adnexa in size, nontender and no masses, RECTAL: normal rectal, no masses, internal hemorrhoids, external hemorrhoids non-thrombosed, PAP: Pap smear done today, thin-prep method, DNA probe for chlamydia and GC obtained, HPV test, exam chaperoned by Sherril Cong. CMA.    No results found for this or any previous visit (from the past 24 hour(s)).  No results found.   ASSESSMENT and PLAN  Problem List Items Addressed This Visit      Cardiovascular and Mediastinum   Hemorrhoid prolapse   Relevant Medications   Phenylephrine-[place] (PREPARATION H) 0.25-50 % GEL   hydrocortisone  (ANUSOL-HC) 25 MG suppository   Other Relevant Orders   Ambulatory referral to Gastroenterology    Other Visit Diagnoses    Annual physical exam    -  Primary   Relevant Orders   CBC with Differential   Comprehensive metabolic panel   Lipid Panel   Hemoglobin A1c   Vitamin D, 25-hydroxy   TSH   Encounter for screening mammogram for malignant neoplasm of breast       Relevant Orders   MM 3D SCREEN BREAST BILATERAL   Screening for cervical cancer       Relevant Orders   Cytology - PAP([place])   Encounter for  initial prescription of transdermal patch hormonal contraceptive device       Relevant Medications   norelgestromin-ethinyl estradiol (ORTHO EVRA) 150-35 MCG/24HR transdermal patch   Pelvic pain       Relevant Medications   meloxicam (MOBIC) 15 MG tablet      Plan . Will follow up with lab results . Medication refills sent . Restarted on Birth control patch . Referral placed to GI for hemorrhoids: current treatment plan includes suppositories and external cream . Order for mammogram placed . Pap done today   Return in about 6 months (around 07/06/2021).    Huston Foley Udell Mazzocco, FNP-BC [facility]

## 2021-01-04 NOTE — Patient Instructions (Addendum)
Preventive Care 84-45 Years Old, Female Preventive care refers to lifestyle choices and visits with your health care provider that can promote health and wellness. This includes:  A yearly physical exam. This is also called an annual wellness visit.  Regular dental and eye exams.  Immunizations.  Screening for certain conditions.  Healthy lifestyle choices, such as: ? Eating a healthy diet. ? Getting regular exercise. ? Not using drugs or products that contain nicotine and tobacco. ? Limiting alcohol use. What can I expect for my preventive care visit? Physical exam Your health care provider will check your:  Height and weight. These may be used to calculate your BMI (body mass index). BMI is a measurement that tells if you are at a healthy weight.  Heart rate and blood pressure.  Body temperature.  Skin for abnormal spots. Counseling Your health care provider may ask you questions about your:  Past medical problems.  Family's medical history.  Alcohol, tobacco, and drug use.  Emotional well-being.  Home life and relationship well-being.  Sexual activity.  Diet, exercise, and sleep habits.  Work and work Statistician.  Access to firearms.  Method of birth control.  Menstrual cycle.  Pregnancy history. What immunizations do I need? Vaccines are usually given at various ages, according to a schedule. Your health care provider will recommend vaccines for you based on your age, medical history, and lifestyle or other factors, such as travel or where you work.   What tests do I need? Blood tests  Lipid and cholesterol levels. These may be checked every 5 years, or more often if you are over 3 years old.  Hepatitis C test.  Hepatitis B test. Screening  Lung cancer screening. You may have this screening every year starting at age 73 if you have a 30-pack-year history of smoking and currently smoke or have quit within the past 15 years.  Colorectal cancer  screening. ? All adults should have this screening starting at age 52 and continuing until age 17. ? Your health care provider may recommend screening at age 49 if you are at increased risk. ? You will have tests every 1-10 years, depending on your results and the type of screening test.  Diabetes screening. ? This is done by checking your blood sugar (glucose) after you have not eaten for a while (fasting). ? You may have this done every 1-3 years.  Mammogram. ? This may be done every 1-2 years. ? Talk with your health care provider about when you should start having regular mammograms. This may depend on whether you have a family history of breast cancer.  BRCA-related cancer screening. This may be done if you have a family history of breast, ovarian, tubal, or peritoneal cancers.  Pelvic exam and Pap test. ? This may be done every 3 years starting at age 10. ? Starting at age 11, this may be done every 5 years if you have a Pap test in combination with an HPV test. Other tests  STD (sexually transmitted disease) testing, if you are at risk.  Bone density scan. This is done to screen for osteoporosis. You may have this scan if you are at high risk for osteoporosis. Talk with your health care provider about your test results, treatment options, and if necessary, the need for more tests. Follow these instructions at home: Eating and drinking  Eat a diet that includes fresh fruits and vegetables, whole grains, lean protein, and low-fat dairy products.  Take vitamin and mineral supplements  as recommended by your health care provider.  Do not drink alcohol if: ? Your health care provider tells you not to drink. ? You are pregnant, may be pregnant, or are planning to become pregnant.  If you drink alcohol: ? Limit how much you have to 0-1 drink a day. ? Be aware of how much alcohol is in your drink. In the [place] one drink equals one 12 oz bottle of beer (355 mL), one 5 oz glass of  wine (148 mL), or one 1 oz glass of hard liquor (44 mL).   Lifestyle  Take daily care of your teeth and gums. Brush your teeth every morning and night with fluoride toothpaste. Floss one time each day.  Stay active. Exercise for at least 30 minutes 5 or more days each week.  Do not use any products that contain nicotine or tobacco, such as cigarettes, e-cigarettes, and chewing tobacco. If you need help quitting, ask your health care provider.  Do not use drugs.  If you are sexually active, practice safe sex. Use a condom or other form of protection to prevent STIs (sexually transmitted infections).  If you do not wish to become pregnant, use a form of birth control. If you plan to become pregnant, see your health care provider for a prepregnancy visit.  If told by your health care provider, take low-dose aspirin daily starting at age 84.  Find healthy ways to cope with stress, such as: ? Meditation, yoga, or listening to music. ? Journaling. ? Talking to a trusted person. ? Spending time with friends and family. Safety  Always wear your seat belt while driving or riding in a vehicle.  Do not drive: ? If you have been drinking alcohol. Do not ride with someone who has been drinking. ? When you are tired or distracted. ? While texting.  Wear a helmet and other protective equipment during sports activities.  If you have firearms in your house, make sure you follow all gun safety procedures. What's next?  Visit your health care provider once a year for an annual wellness visit.  Ask your health care provider how often you should have your eyes and teeth checked.  Stay up to date on all vaccines. This information is not intended to replace advice given to you by your health care provider. Make sure you discuss any questions you have with your health care provider. Document Revised: 06/13/2020 Document Reviewed: 05/21/2018 Elsevier Patient Education  2021 [place].  Hemorrhoids Hemorrhoids are swollen veins that may develop:  In the butt (rectum). These are called internal hemorrhoids.  Around the opening of the butt (anus). These are called external hemorrhoids. Hemorrhoids can cause pain, itching, or bleeding. Most of the time, they do not cause serious problems. They usually get better with diet changes, lifestyle changes, and other home treatments. What are the causes? This condition may be caused by:  Having trouble pooping (constipation).  Pushing hard (straining) to poop.  Watery poop (diarrhea).  Pregnancy.  Being very overweight (obese).  Sitting for long periods of time.  Heavy lifting or other activity that causes you to strain.  Anal sex.  Riding a bike for a long period of time. What are the signs or symptoms? Symptoms of this condition include:  Pain.  Itching or soreness in the butt.  Bleeding from the butt.  Leaking poop.  Swelling in the area.  One or more lumps around the opening of your butt. How is this diagnosed? A  doctor can often diagnose this condition by looking at the affected area. The doctor may also:  Do an exam that involves feeling the area with a gloved hand (digital rectal exam).  Examine the area inside your butt using a small tube (anoscope).  Order blood tests. This may be done if you have lost a lot of blood.  Have you get a test that involves looking inside the colon using a flexible tube with a camera on the end (sigmoidoscopy or colonoscopy). How is this treated? This condition can usually be treated at home. Your doctor may tell you to change what you eat, make lifestyle changes, or try home treatments. If these do not help, procedures can be done to remove the hemorrhoids or make them smaller. These may involve:  Placing rubber bands at the base of the hemorrhoids to cut off their blood supply.  Injecting medicine into the hemorrhoids to shrink them.  Shining a type of  light energy onto the hemorrhoids to cause them to fall off.  Doing surgery to remove the hemorrhoids or cut off their blood supply. Follow these instructions at home: Eating and drinking  Eat foods that have a lot of fiber in them. These include whole grains, beans, nuts, fruits, and vegetables.  Ask your doctor about taking products that have added fiber (fibersupplements).  Reduce the amount of fat in your diet. You can do this by: ? Eating low-fat dairy products. ? Eating less red meat. ? Avoiding processed foods.  Drink enough fluid to keep your pee (urine) pale yellow.   Managing pain and swelling  Take a warm-water bath (sitz bath) for 20 minutes to ease pain. Do this 3-4 times a day. You may do this in a bathtub or using a portable sitz bath that fits over the toilet.  If told, put ice on the painful area. It may be helpful to use ice between your warm baths. ? Put ice in a plastic bag. ? Place a towel between your skin and the bag. ? Leave the ice on for 20 minutes, 2-3 times a day.   General instructions  Take over-the-counter and prescription medicines only as told by your doctor. ? Medicated creams and medicines may be used as told.  Exercise often. Ask your doctor how much and what kind of exercise is best for you.  Go to the bathroom when you have the urge to poop. Do not wait.  Avoid pushing too hard when you poop.  Keep your butt dry and clean. Use wet toilet paper or moist towelettes after pooping.  Do not sit on the toilet for a long time.  Keep all follow-up visits as told by your doctor. This is important. Contact a doctor if you:  Have pain and swelling that do not get better with treatment or medicine.  Have trouble pooping.  Cannot poop.  Have pain or swelling outside the area of the hemorrhoids. Get help right away if you have:  Bleeding that will not stop. Summary  Hemorrhoids are swollen veins in the butt or around the opening of the  butt.  They can cause pain, itching, or bleeding.  Eat foods that have a lot of fiber in them. These include whole grains, beans, nuts, fruits, and vegetables.  Take a warm-water bath (sitz bath) for 20 minutes to ease pain. Do this 3-4 times a day. This information is not intended to replace advice given to you by your health care provider. Make sure you discuss  any questions you have with your health care provider. Document Revised: 09/17/2018 Document Reviewed: 01/29/2018 Elsevier Patient Education  [place].

## 2021-01-05 ENCOUNTER — Other Ambulatory Visit: Payer: Self-pay | Admitting: Family Medicine

## 2021-01-05 DIAGNOSIS — D72829 Elevated white blood cell count, unspecified: Secondary | ICD-10-CM

## 2021-01-05 LAB — CBC WITH DIFFERENTIAL/PLATELET
Absolute Monocytes: 673 cells/uL (ref 200–950)
Basophils Absolute: 53 cells/uL (ref 0–200)
Basophils Relative: 0.3 %
Eosinophils Absolute: 53 cells/uL (ref 15–500)
HCT: 40 % (ref 35.0–45.0)
Hemoglobin: 12.8 g/dL (ref 11.7–15.5)
Lymphs Abs: 549 cells/uL — ABNORMAL LOW (ref 850–3900)
MCH: 28.4 pg (ref 27.0–33.0)
MCHC: 32 g/dL (ref 32.0–36.0)
MCV: 88.7 fL (ref 80.0–100.0)
MPV: 11.3 fL (ref 7.5–12.5)
Monocytes Relative: 3.8 %
Neutro Abs: 16373 cells/uL — ABNORMAL HIGH (ref 1500–7800)
Neutrophils Relative %: 92.5 %
Platelets: 225 10*3/uL (ref 140–400)
RDW: 12.1 % (ref 11.0–15.0)
Total Lymphocyte: 3.1 %

## 2021-01-05 LAB — TSH: TSH: 1.01 mIU/L

## 2021-01-05 LAB — LIPID PANEL
Cholesterol: 188 mg/dL (ref <200)
HDL: 64 mg/dL (ref >=50)
LDL Cholesterol (Calc): 106 mg/dL (calc) — ABNORMAL HIGH
Non-HDL Cholesterol (Calc): 124 mg/dL (calc) (ref <130)
Total CHOL/HDL Ratio: 2.9 (calc) (ref <5.0)
Triglycerides: 85 mg/dL (ref <150)

## 2021-01-05 LAB — VITAMIN D 25 HYDROXY (VIT D DEFICIENCY, FRACTURES): Vit D, 25-Hydroxy: 16 ng/mL — ABNORMAL LOW (ref 30–100)

## 2021-01-05 LAB — HEMOGLOBIN A1C
Hgb A1c MFr Bld: 5.4 % of total Hgb (ref <5.7)
Mean Plasma Glucose: 108 mg/dL
eAG (mmol/L): 6 mmol/L

## 2021-01-05 NOTE — Progress Notes (Signed)
Overall your labs look good. Your Vitamin D did come back quite low. I would recommend taking 2000 IU of Over the counter Vitamin D3 with food daily. Your White count was quite elevated as was your neutrophils which indicates a bacterial infection. I would like you to return to clinic in a week to get this rechecked to assure it has resolved.

## 2021-01-09 LAB — CYTOLOGY - PAP
Chlamydia: NEGATIVE
Comment: NEGATIVE
Comment: NEGATIVE
Comment: NEGATIVE
Comment: NEGATIVE
Comment: NORMAL
Diagnosis: NEGATIVE
HSV1: NEGATIVE
HSV2: NEGATIVE
High risk HPV: NEGATIVE
Neisseria Gonorrhea: NEGATIVE
Trichomonas: NEGATIVE

## 2021-01-09 NOTE — Progress Notes (Signed)
PaP results came back. Negative for abnormalities and negative for HPV. No further screening needed for 5 years.

## 2021-01-18 ENCOUNTER — Encounter: Payer: Self-pay | Admitting: *Deleted

## 2021-01-22 ENCOUNTER — Other Ambulatory Visit: Payer: Self-pay

## 2021-01-22 DIAGNOSIS — D72829 Elevated white blood cell count, unspecified: Secondary | ICD-10-CM | POA: Diagnosis not present

## 2021-01-22 LAB — CBC WITH DIFFERENTIAL/PLATELET
Absolute Monocytes: 400 cells/uL (ref 200–950)
Basophils Absolute: 52 cells/uL (ref 0–200)
Basophils Relative: 0.9 %
Eosinophils Absolute: 389 cells/uL (ref 15–500)
Eosinophils Relative: 6.7 %
HCT: 38.9 % (ref 35.0–45.0)
Hemoglobin: 12.6 g/dL (ref 11.7–15.5)
Lymphs Abs: 1949 cells/uL (ref 850–3900)
MCH: 28.8 pg (ref 27.0–33.0)
MCHC: 32.4 g/dL (ref 32.0–36.0)
MCV: 88.8 fL (ref 80.0–100.0)
MPV: 11.4 fL (ref 7.5–12.5)
Monocytes Relative: 6.9 %
Neutro Abs: 3010 cells/uL (ref 1500–7800)
Neutrophils Relative %: 51.9 %
Platelets: 261 10*3/uL (ref 140–400)
RBC: 4.38 10*6/uL (ref 3.80–5.10)
RDW: 12.4 % (ref 11.0–15.0)
Total Lymphocyte: 33.6 %
WBC: 5.8 10*3/uL (ref 3.8–10.8)

## 2021-03-12 ENCOUNTER — Encounter: Payer: Self-pay | Admitting: Gastroenterology

## 2021-03-12 ENCOUNTER — Ambulatory Visit (INDEPENDENT_AMBULATORY_CARE_PROVIDER_SITE_OTHER): Payer: BC Managed Care – PPO | Admitting: Gastroenterology

## 2021-03-12 ENCOUNTER — Other Ambulatory Visit: Payer: Self-pay

## 2021-03-12 DIAGNOSIS — K648 Other hemorrhoids: Secondary | ICD-10-CM | POA: Diagnosis not present

## 2021-03-12 NOTE — Progress Notes (Signed)
PROCEDURE NOTE: The patient presents with symptomatic grade 3 hemorrhoids, unresponsive to maximal medical therapy, requesting rubber band ligation of his/her hemorrhoidal disease.  All risks, benefits and alternative forms of therapy were described and informed consent was obtained.  In the Left Lateral Decubitus position (if anoscopy is performed) anoscopic examination revealed grade 3 hemorrhoids in the all position(s).   The decision was made to band the RP internal hemorrhoid, and the [facility] O'Regan System was used to perform band ligation without complication.  Digital anorectal examination was then performed to assure proper positioning of the band, and to adjust the banded tissue as required.  The patient was discharged home without pain or other issues.  Dietary and behavioral recommendations were given and (if necessary - prescriptions were given), along with follow-up instructions.  The patient will return 2 weeks for follow-up and possible additional banding as required.  No complications were encountered and the patient tolerated the procedure well.   

## 2021-03-12 NOTE — Progress Notes (Signed)
Julie Darby, MD [address]  [place]  [place], [place] [address]  Main: [phone]  Fax: [phone] Pager: [phone]   Consultation  Referring Provider:     Delsa Grana, PA-C Primary Care Physician:  Delsa Grana, PA-C Primary Gastroenterologist:  Dr. Sherri Sear         Reason for Consultation:     Prolapse of the hemorrhoids  Date of Admission:  (Not on file) Date of Consultation:  03/12/2021         HPI:   Julie Fry is a 45 y.o. female with approximately 3 years history of protrusion of hemorrhoids every time she has a BM that she has to manually reduce.  This started after the delivery of her child 3 years ago.  She used to experience constipation, currently having formed bowel movements daily.  However, associated with straining and spends about 10 to 15 minutes on the toilet during a BM.  She denies any rectal bleeding, pain, itching, swelling.  She is only concerned about prolapse of the hemorrhoids. No evidence of anemia.  Patient is accompanied by her boyfriend who helps with interpretation today  NSAIDs: None  Antiplts/Anticoagulants/Anti thrombotics: None  GI Procedures: None  Past Medical History:  Diagnosis Date   Medical history non-contributory     Past Surgical History:  Procedure Laterality Date   DILATION AND EVACUATION N/A 08/07/2015   Procedure: DILATATION AND EVACUATION;  Surgeon: Boykin Nearing, MD;  Location: [facility] ORS;  Service: Gynecology;  Laterality: N/A;     Current Outpatient Medications:    hydrocortisone (ANUSOL-HC) 25 MG suppository, Place 1 suppository (25 mg total) rectally 2 (two) times daily., Disp: 12 suppository, Rfl: 0   Family History  Problem Relation Age of Onset   Cancer Father      Social History   Tobacco Use   Smoking status: Never   Smokeless tobacco: Never  Vaping Use   Vaping Use: Never used  Substance Use Topics   Alcohol use: No    Alcohol/week: 1.0 standard drink     Types: 1 Glasses of wine per week   Drug use: No    Allergies as of 03/12/2021   (No Known Allergies)    Review of Systems:    All systems reviewed and negative except where noted in HPI.   Physical Exam:  Vital signs in last 24 hours: Vitals:   03/12/21 1402  Weight: 108 lb 8 oz (49.2 kg)  Height: 4\' 11"  (1.499 m)    Vitals with BMI 03/12/2021 01/04/2021 05/30/2020  Height 4\' 11"  4\' 11"  4\' 11"   Weight 108 lbs 8 oz 109 lbs 3 oz 110 lbs 3 oz  BMI 21.9 66.29 47.65  Systolic 465 035 465  Diastolic 77 72 70  Pulse 60 92 80      General:   Pleasant, cooperative in NAD Head:  Normocephalic and atraumatic. Eyes:   No icterus.   Conjunctiva pink. PERRLA. Ears:  Normal auditory acuity. Neck:  Supple; no masses or thyroidomegaly Lungs: Respirations even and unlabored. Lungs clear to auscultation bilaterally.   No wheezes, crackles, or rhonchi.  Heart:  Regular rate and rhythm;  Without murmur, clicks, rubs or gallops Abdomen:  Soft, nondistended, nontender. Normal bowel sounds. No appreciable masses or hepatomegaly.  No rebound or guarding.  Rectal: Perianal skin tags, nontender digital rectal exam, anoscopy revealed normal distal rectal mucosa, palpable hemorrhoids Msk:  Symmetrical without gross deformities.  Strength normal Extremities:  Without edema,  cyanosis or clubbing. Neurologic:  Alert and oriented x3;  grossly normal neurologically. Skin:  Intact without significant lesions or rashes. Psych:  Alert and cooperative. Normal affect.  LAB RESULTS: CBC Latest Ref Rng & Units 01/22/2021 01/04/2021 04/13/2018  WBC 3.8 - 10.8 Thousand/uL 5.8 17.7(H) 16.6(H)  Hemoglobin 11.7 - 15.5 g/dL 12.6 12.8 11.5(L)  Hematocrit 35.0 - 45.0 % 38.9 40.0 33.9(L)  Platelets 140 - 400 Thousand/uL 261 225 183    BMET BMP Latest Ref Rng & Units 08/07/2015  Glucose 65 - 99 mg/dL 97  BUN 6 - 20 mg/dL 8  Creatinine 0.44 - 1.00 mg/dL 0.45  Sodium 135 - 145 mmol/L 136  Potassium 3.5 - 5.1 mmol/L 3.5   Chloride 101 - 111 mmol/L 105  CO2 22 - 32 mmol/L 28  Calcium 8.9 - 10.3 mg/dL 9.3    LFT No flowsheet data found.   STUDIES: No results found.    Impression / Plan:   Julie Fry is a 45 y.o. asian female with no significant past medical history is seen in consultation for grade 3 hemorrhoids  Grade 3 external hemorrhoids I have discussed about hemorrhoid ligation which can only help with partial improvement of hemorrhoid prolapse Discussed about the risks and benefits of procedures Patient is agreeable to undergo hemorrhoid ligation, consent obtained Perform hemorrhoid ligation today  Follow-up in 2 weeks  Thank you for involving me in the care of this patient.     Sherri Sear, MD  03/12/2021, 3:44 PM    Note: This dictation was prepared with Dragon dictation along with smaller phrase technology. Any transcriptional errors that result from this process are unintentional.

## 2021-03-12 NOTE — Patient Instructions (Signed)
High-Fiber Eating Plan °Fiber, also called dietary fiber, is a type of carbohydrate. It is found foods such as fruits, vegetables, whole grains, and beans. A high-fiber diet can have many health benefits. Your health care provider may recommend a high-fiber diet to help: °Prevent constipation. Fiber can make your bowel movements more regular. °Lower your cholesterol. °Relieve the following conditions: °Inflammation of veins in the anus (hemorrhoids). °Inflammation of specific areas of the digestive tract (uncomplicated diverticulosis). °A problem of the large intestine, also called the colon, that sometimes causes pain and diarrhea (irritable bowel syndrome, or IBS). °Prevent overeating as part of a weight-loss plan. °Prevent heart disease, type 2 diabetes, and certain cancers. °What are tips for following this plan? °Reading food labels ° °Check the nutrition facts label on food products for the amount of dietary fiber. Choose foods that have 5 grams of fiber or more per serving. °The goals for recommended daily fiber intake include: °Men (age 50 or younger): 34-38 g. °Men (over age 50): 28-34 g. °Women (age 50 or younger): 25-28 g. °Women (over age 50): 22-25 g. °Your daily fiber goal is _____________ g. °Shopping °Choose whole fruits and vegetables instead of processed forms, such as apple juice or applesauce. °Choose a wide variety of high-fiber foods such as avocados, lentils, oats, and kidney beans. °Read the nutrition facts label of the foods you choose. Be aware of foods with added fiber. These foods often have high sugar and sodium amounts per serving. °Cooking °Use whole-grain flour for baking and cooking. °Cook with brown rice instead of white rice. °Meal planning °Start the day with a breakfast that is high in fiber, such as a cereal that contains 5 g of fiber or more per serving. °Eat breads and cereals that are made with whole-grain flour instead of refined flour or white flour. °Eat brown rice, bulgur  wheat, or millet instead of white rice. °Use beans in place of meat in soups, salads, and pasta dishes. °Be sure that half of the grains you eat each day are whole grains. °General information °You can get the recommended daily intake of dietary fiber by: °Eating a variety of fruits, vegetables, grains, nuts, and beans. °Taking a fiber supplement if you are not able to take in enough fiber in your diet. It is better to get fiber through food than from a supplement. °Gradually increase how much fiber you consume. If you increase your intake of dietary fiber too quickly, you may have bloating, cramping, or gas. °Drink plenty of water to help you digest fiber. °Choose high-fiber snacks, such as berries, raw vegetables, nuts, and popcorn. °What foods should I eat? °Fruits °Berries. Pears. Apples. Oranges. Avocado. Prunes and raisins. Dried figs. °Vegetables °Sweet potatoes. Spinach. Kale. Artichokes. Cabbage. Broccoli. Cauliflower. Green peas. Carrots. Squash. °Grains °Whole-grain breads. Multigrain cereal. Oats and oatmeal. Brown rice. Barley. Bulgur wheat. Millet. Quinoa. Bran muffins. Popcorn. Rye wafer crackers. °Meats and other proteins °Navy beans, kidney beans, and pinto beans. Soybeans. Split peas. Lentils. Nuts and seeds. °Dairy °Fiber-fortified yogurt. °Beverages °Fiber-fortified soy milk. Fiber-fortified orange juice. °Other foods °Fiber bars. °The items listed above may not be a complete list of recommended foods and beverages. Contact a dietitian for more information. °What foods should I avoid? °Fruits °Fruit juice. Cooked, strained fruit. °Vegetables °Fried potatoes. Canned vegetables. Well-cooked vegetables. °Grains °White bread. Pasta made with refined flour. White rice. °Meats and other proteins °Fatty cuts of meat. Fried chicken or fried fish. °Dairy °Milk. Yogurt. Cream cheese. Sour cream. °Fats and   oils °Butters. °Beverages °Soft drinks. °Other foods °Cakes and pastries. °The items listed above may  not be a complete list of foods and beverages to avoid. Talk with your dietitian about what choices are best for you. °Summary °Fiber is a type of carbohydrate. It is found in foods such as fruits, vegetables, whole grains, and beans. °A high-fiber diet has many benefits. It can help to prevent constipation, lower blood cholesterol, aid weight loss, and reduce your risk of heart disease, diabetes, and certain cancers. °Increase your intake of fiber gradually. Increasing fiber too quickly may cause cramping, bloating, and gas. Drink plenty of water while you increase the amount of fiber you consume. °The best sources of fiber include whole fruits and vegetables, whole grains, nuts, seeds, and beans. °This information is not intended to replace advice given to you by your health care provider. Make sure you discuss any questions you have with your health care provider. °Document Revised: 01/13/2020 Document Reviewed: 01/13/2020 °Elsevier Patient Education © 2022 Elsevier Inc. ° °

## 2021-04-01 IMAGING — DX DG PELVIS 1-2V
1 series · 1 of 1 positions shown · non-contrast
Comparison: None.

CLINICAL DATA: Chronic pelvic pain

EXAM:
PELVIS - 1-2 VIEW

[pelvis ap]
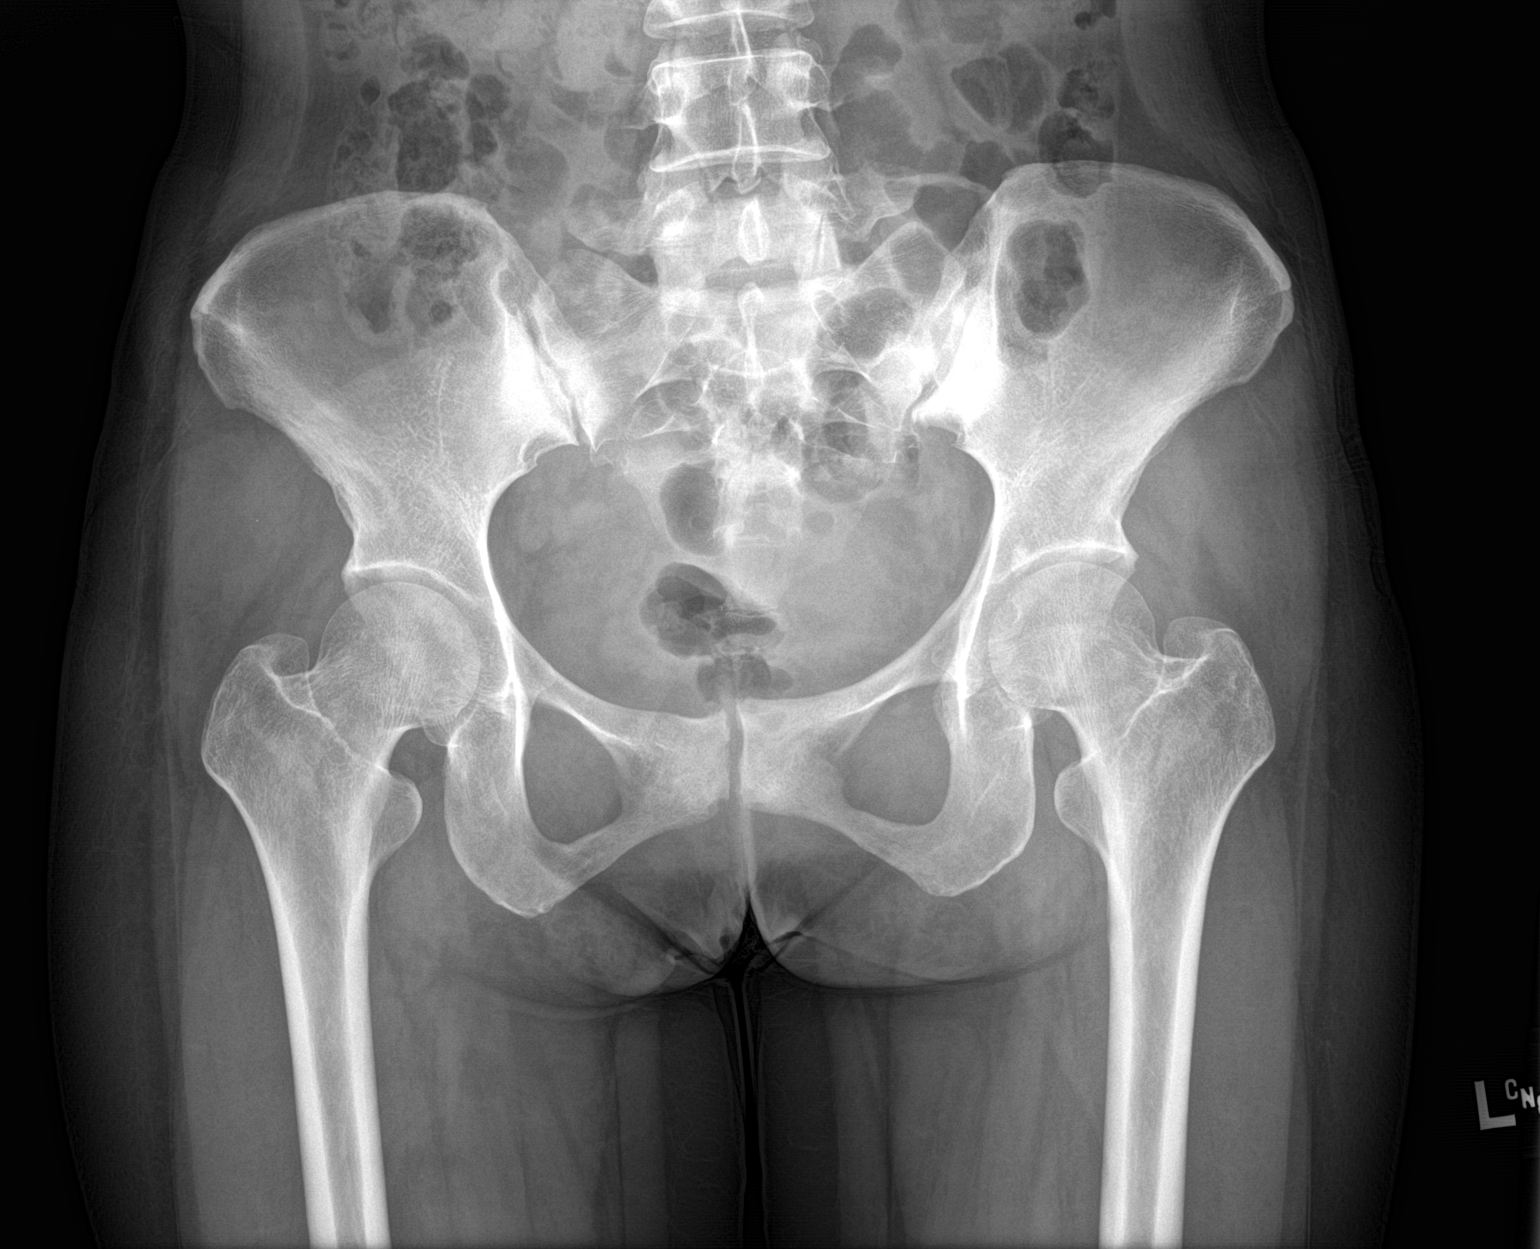

[1 of 1 positions shown; findings below may reference images not displayed]

FINDINGS: There is no evidence of pelvic fracture or dislocation. Joint spaces
appear normal. There is osteitis condensans Ivana Kumasita bilaterally. More
subtle osteitis condensans pubis noted.
IMPRESSION: Areas of osteitis condensans Ivana Kumasita, symmetric bilaterally, as well as
a much lesser degree of osteitis condensans pubis. No appreciable
joint space narrowing or erosion. No fracture or dislocation.

## 2021-04-04 ENCOUNTER — Ambulatory Visit: Payer: BC Managed Care – PPO | Admitting: Gastroenterology

## 2021-04-11 ENCOUNTER — Other Ambulatory Visit: Payer: Self-pay

## 2021-04-11 ENCOUNTER — Encounter: Payer: Self-pay | Admitting: Gastroenterology

## 2021-04-11 ENCOUNTER — Ambulatory Visit (INDEPENDENT_AMBULATORY_CARE_PROVIDER_SITE_OTHER): Payer: BC Managed Care – PPO | Admitting: Gastroenterology

## 2021-04-11 VITALS — BP 106/63 | HR 72 | Temp 98.0°F | Ht 59.0 in | Wt 109.5 lb

## 2021-04-11 DIAGNOSIS — K641 Second degree hemorrhoids: Secondary | ICD-10-CM

## 2021-04-11 DIAGNOSIS — K648 Other hemorrhoids: Secondary | ICD-10-CM

## 2021-04-11 NOTE — Progress Notes (Signed)
PROCEDURE NOTE: The patient presents with symptomatic grade 3 hemorrhoids, unresponsive to maximal medical therapy, requesting rubber band ligation of his/her hemorrhoidal disease.  All risks, benefits and alternative forms of therapy were described and informed consent was obtained.  The decision was made to band the RA internal hemorrhoid, and the [facility] O'Regan System was used to perform band ligation without complication.  Digital anorectal examination was then performed to assure proper positioning of the band, and to adjust the banded tissue as required.  The patient was discharged home without pain or other issues.  Dietary and behavioral recommendations were given and (if necessary - prescriptions were given), along with follow-up instructions.  The patient will return 2 weeks for follow-up and possible additional banding as required.  No complications were encountered and the patient tolerated the procedure well.    

## 2021-05-14 ENCOUNTER — Other Ambulatory Visit: Payer: Self-pay

## 2021-05-14 ENCOUNTER — Ambulatory Visit (INDEPENDENT_AMBULATORY_CARE_PROVIDER_SITE_OTHER): Payer: BC Managed Care – PPO | Admitting: Gastroenterology

## 2021-05-14 ENCOUNTER — Encounter: Payer: Self-pay | Admitting: Gastroenterology

## 2021-05-14 VITALS — BP 118/80 | HR 76 | Temp 98.5°F | Ht 59.0 in | Wt 110.4 lb

## 2021-05-14 DIAGNOSIS — K648 Other hemorrhoids: Secondary | ICD-10-CM

## 2021-05-14 DIAGNOSIS — K641 Second degree hemorrhoids: Secondary | ICD-10-CM

## 2021-05-14 DIAGNOSIS — Z1211 Encounter for screening for malignant neoplasm of colon: Secondary | ICD-10-CM

## 2021-05-14 MED ORDER — NA SULFATE-K SULFATE-MG SULF 17.5-3.13-1.6 GM/177ML PO SOLN: 354.0000 mL | Freq: Once | ORAL | 0 refills | Status: AC

## 2021-05-14 MED ORDER — CLENPIQ 10-3.5-12 MG-GM -GM/160ML PO SOLN: 320.0000 mL | Freq: Once | ORAL | 0 refills | Status: AC

## 2021-05-14 NOTE — Addendum Note (Signed)
Addended by: Ulyess Blossom L on: 05/14/2021 02:02 PM   Modules accepted: Orders

## 2021-05-14 NOTE — Progress Notes (Signed)
PROCEDURE NOTE: The patient presents with symptomatic grade 2 hemorrhoids, unresponsive to maximal medical therapy, requesting rubber band ligation of his/her hemorrhoidal disease.  All risks, benefits and alternative forms of therapy were described and informed consent was obtained.  The decision was made to band the LL internal hemorrhoid, and the Neponset was used to perform band ligation without complication.  Digital anorectal examination was then performed to assure proper positioning of the band, and to adjust the banded tissue as required.  The patient was discharged home without pain or other issues.  Dietary and behavioral recommendations were given and (if necessary - prescriptions were given), along with follow-up instructions.  The patient will return 2 weeks for follow-up and possible additional banding as required.  No complications were encountered and the patient tolerated the procedure well.  Also, discussed with patient regarding screening colonoscopy and she is agreeable.  Her fianc was on the phone with her to help with interpretation  I have discussed alternative options, risks & benefits,  which include, but are not limited to, bleeding, infection, perforation,respiratory complication & drug reaction.  The patient agrees with this plan & written consent will be obtained.

## 2021-06-05 ENCOUNTER — Encounter: Payer: Self-pay | Admitting: Gastroenterology

## 2021-06-06 ENCOUNTER — Encounter
Admission: RE | Disposition: A | Discharge status: Home / Self Care | Payer: Self-pay | Source: Home / Self Care | Attending: Gastroenterology

## 2021-06-06 ENCOUNTER — Ambulatory Visit: Payer: BC Managed Care – PPO | Admitting: Anesthesiology

## 2021-06-06 ENCOUNTER — Ambulatory Visit
Admission: RE | Admit: 2021-06-06 | Discharge: 2021-06-06 | Disposition: A | Discharge status: Home / Self Care | Payer: BC Managed Care – PPO | Attending: Gastroenterology | Admitting: Gastroenterology

## 2021-06-06 ENCOUNTER — Encounter: Payer: Self-pay | Admitting: Gastroenterology

## 2021-06-06 DIAGNOSIS — K644 Residual hemorrhoidal skin tags: Secondary | ICD-10-CM | POA: Diagnosis not present

## 2021-06-06 DIAGNOSIS — Z1211 Encounter for screening for malignant neoplasm of colon: Secondary | ICD-10-CM

## 2021-06-06 DIAGNOSIS — Z809 Family history of malignant neoplasm, unspecified: Secondary | ICD-10-CM | POA: Diagnosis not present

## 2021-06-06 DIAGNOSIS — K626 Ulcer of anus and rectum: Secondary | ICD-10-CM | POA: Insufficient documentation

## 2021-06-06 DIAGNOSIS — Z79899 Other long term (current) drug therapy: Secondary | ICD-10-CM | POA: Diagnosis not present

## 2021-06-06 HISTORY — PX: COLONOSCOPY WITH PROPOFOL: SHX5780

## 2021-06-06 LAB — POCT PREGNANCY, URINE: Preg Test, Ur: NEGATIVE

## 2021-06-06 SURGERY — COLONOSCOPY WITH PROPOFOL
Anesthesia: General

## 2021-06-06 MED ORDER — PROPOFOL 500 MG/50ML IV EMUL
INTRAVENOUS | Status: DC | PRN
  Administered 2021-06-06: 150 ug/kg/min via INTRAVENOUS

## 2021-06-06 MED ORDER — SODIUM CHLORIDE 0.9 % IV SOLN: INTRAVENOUS | Status: DC

## 2021-06-06 MED ORDER — PROPOFOL 10 MG/ML IV BOLUS
INTRAVENOUS | Status: DC | PRN
  Administered 2021-06-06: 70 mg via INTRAVENOUS
  Administered 2021-06-06: 30 mg via INTRAVENOUS

## 2021-06-06 MED ORDER — LIDOCAINE HCL (CARDIAC) PF 100 MG/5ML IV SOSY
PREFILLED_SYRINGE | INTRAVENOUS | Status: DC | PRN
  Administered 2021-06-06: 50 mg via INTRAVENOUS

## 2021-06-06 MED ORDER — PROPOFOL 500 MG/50ML IV EMUL
INTRAVENOUS | Status: AC
  Filled 2021-06-06: qty 50

## 2021-06-06 NOTE — Anesthesia Preprocedure Evaluation (Addendum)
Anesthesia Evaluation  Patient identified by MRN, date of birth, ID band Patient awake    Reviewed: Allergy & Precautions, NPO status , Patient's Chart, lab work & pertinent test results  History of Anesthesia Complications Negative for: history of anesthetic complications  Airway Mallampati: III  TM Distance: >3 FB Neck ROM: full    Dental  (+) Chipped, Missing   Pulmonary neg pulmonary ROS, neg shortness of breath,    Pulmonary exam normal        Cardiovascular Exercise Tolerance: Good (-) anginanegative cardio ROS Normal cardiovascular exam     Neuro/Psych negative neurological ROS  negative psych ROS   GI/Hepatic negative GI ROS, Neg liver ROS, neg GERD  ,  Endo/Other  negative endocrine ROS  Renal/GU negative Renal ROS  negative genitourinary   Musculoskeletal   Abdominal   Peds  Hematology negative hematology ROS (+)   Anesthesia Other Findings Past Medical History: No date: Medical history non-contributory  Past Surgical History: 08/07/2015: DILATION AND EVACUATION; N/A     Comment:  Procedure: DILATATION AND EVACUATION;  Surgeon: Boykin Nearing, MD;  Location: [facility] ORS;  Service:               Gynecology;  Laterality: N/A;     Reproductive/Obstetrics negative OB ROS                             Anesthesia Physical Anesthesia Plan  ASA: 1  Anesthesia Plan: General   Post-op Pain Management:    Induction: Intravenous  PONV Risk Score and Plan: Propofol infusion and TIVA  Airway Management Planned: Natural Airway and Nasal Cannula  Additional Equipment:   Intra-op Plan:   Post-operative Plan:   Informed Consent: I have reviewed the patients History and Physical, chart, labs and discussed the procedure including the risks, benefits and alternatives for the proposed anesthesia with the patient or authorized representative who has indicated  his/her understanding and acceptance.     Dental Advisory Given and Interpreter used for interveiw  Plan Discussed with: Anesthesiologist, CRNA and Surgeon  Anesthesia Plan Comments: (Patient consented for risks of anesthesia including but not limited to:  - adverse reactions to medications - risk of airway placement if required - damage to eyes, teeth, lips or other oral mucosa - nerve damage due to positioning  - sore throat or hoarseness - Damage to heart, brain, nerves, lungs, other parts of body or loss of life  Patient voiced understanding.)       Anesthesia Quick Evaluation

## 2021-06-06 NOTE — Anesthesia Procedure Notes (Signed)
Date/Time: 06/06/2021 9:44 AM Performed by: Johnna Acosta, CRNA Pre-anesthesia Checklist: Patient identified, Emergency Drugs available, Suction available, Patient being monitored and Timeout performed Patient Re-evaluated:Patient Re-evaluated prior to induction Oxygen Delivery Method: Nasal cannula Preoxygenation: Pre-oxygenation with 100% oxygen Induction Type: IV induction

## 2021-06-06 NOTE — Anesthesia Postprocedure Evaluation (Signed)
Anesthesia Post Note  Patient: Financial planner  Procedure(s) Performed: COLONOSCOPY WITH PROPOFOL  Patient location during evaluation: Endoscopy Anesthesia Type: General Level of consciousness: awake and alert Pain management: pain level controlled Vital Signs Assessment: post-procedure vital signs reviewed and stable Respiratory status: spontaneous breathing, nonlabored ventilation, respiratory function stable and patient connected to nasal cannula oxygen Cardiovascular status: blood pressure returned to baseline and stable Postop Assessment: no apparent nausea or vomiting Anesthetic complications: no   No notable events documented.   Last Vitals:  Vitals:   06/06/21 1023 06/06/21 1033  BP: 121/70 125/68  Pulse: (!) 51 (!) 51  Resp: 13 14  Temp:    SpO2: 100% 100%    Last Pain:  Vitals:   06/06/21 1033  TempSrc:   PainSc: 0-No pain                 Precious Haws Angee Gupton

## 2021-06-06 NOTE — H&P (Signed)
Julie Darby, MD [address]  [place]  [place], [place] [address]  Main: [phone]  Fax: [phone] Pager: [phone]  Primary Care Physician:  Delsa Grana, PA-C Primary Gastroenterologist:  Dr. Cephas Fry  Pre-Procedure History & Physical: HPI:  Julie Fry is a 45 y.o. female is here for an colonoscopy.   Past Medical History:  Diagnosis Date   Medical history non-contributory     Past Surgical History:  Procedure Laterality Date   DILATION AND EVACUATION N/A 08/07/2015   Procedure: DILATATION AND EVACUATION;  Surgeon: Boykin Nearing, MD;  Location: [facility] ORS;  Service: Gynecology;  Laterality: N/A;    Prior to Admission medications   Medication Sig Start Date End Date Taking? Authorizing Provider  hydrocortisone (ANUSOL-HC) 25 MG suppository Place 1 suppository (25 mg total) rectally 2 (two) times daily. Patient not taking: Reported on 06/06/2021 01/04/21   Just, Laurita Quint, FNP    Allergies as of 05/14/2021   (No Known Allergies)    Family History  Problem Relation Age of Onset   Cancer Father     Social History   Socioeconomic History   Marital status: Single    Spouse name: Not on file   Number of children: Not on file   Years of education: Not on file   Highest education level: Not on file  Occupational History   Not on file  Tobacco Use   Smoking status: Never   Smokeless tobacco: Never  Vaping Use   Vaping Use: Never used  Substance and Sexual Activity   Alcohol use: No    Alcohol/week: 1.0 standard drink    Types: 1 Glasses of wine per week   Drug use: No   Sexual activity: Not Currently    Partners: Male  Other Topics Concern   Not on file  Social History Narrative   Not on file   Social Determinants of Health   Financial Resource Strain: Low Risk    Difficulty of Paying Living Expenses: Not hard at all  Food Insecurity: No Food Insecurity   Worried About Charity fundraiser in the Last Year: Never  true   Ran Out of Food in the Last Year: Never true  Transportation Needs: No Transportation Needs   Lack of Transportation (Medical): No   Lack of Transportation (Non-Medical): No  Physical Activity: Inactive   Days of Exercise per Week: 0 days   Minutes of Exercise per Session: 0 min  Stress: No Stress Concern Present   Feeling of Stress : Not at all  Social Connections: Socially Isolated   Frequency of Communication with Friends and Family: More than three times a week   Frequency of Social Gatherings with Friends and Family: More than three times a week   Attends Religious Services: Never   Marine scientist or Organizations: No   Attends Music therapist: Never   Marital Status: Never married  Human resources officer Violence: Not At Risk   Fear of Current or Ex-Partner: No   Emotionally Abused: No   Physically Abused: No   Sexually Abused: No    Review of Systems: See HPI, otherwise negative ROS  Physical Exam: BP 136/90   Pulse 69   Temp (!) 97.1 F (36.2 C) (Temporal)   Resp 14   Ht '5\' 4"'$  (1.626 m)   Wt 49.9 kg   LMP 06/06/2021 Comment: negative test  SpO2 100%   BMI 18.88 kg/m  General:   Alert,  pleasant  and cooperative in NAD Head:  Normocephalic and atraumatic. Neck:  Supple; no masses or thyromegaly. Lungs:  Clear throughout to auscultation.    Heart:  Regular rate and rhythm. Abdomen:  Soft, nontender and nondistended. Normal bowel sounds, without guarding, and without rebound.   Neurologic:  Alert and  oriented x4;  grossly normal neurologically.  Impression/Plan: Julie Fry is here for an colonoscopy to be performed for colon cancer screening  Risks, benefits, limitations, and alternatives regarding  colonoscopy have been reviewed with the patient.  Questions have been answered.  All parties agreeable.   Sherri Sear, MD  06/06/2021, 9:31 AM

## 2021-06-06 NOTE — Op Note (Signed)
[facility] Gastroenterology Patient Name: Julie Fry Procedure Date: 06/06/2021 9:38 AM MRN: [id number] Account #: [id number] Date of Birth: [birth year] Admit Type: Outpatient Age: 45 Room: [facility] ENDO ROOM 4 Gender: Female Note Status: Finalized Instrument Name: Peds Colonoscope [id number] Procedure:             Colonoscopy Indications:           Screening for colorectal malignant neoplasm, This is                         the patient's first colonoscopy Providers:             Lin Landsman MD, MD Referring MD:          Delsa Grana (Referring MD) Medicines:             General Anesthesia Complications:         No immediate complications. Estimated blood loss: None. Procedure:             Pre-Anesthesia Assessment:                        - Prior to the procedure, a History and Physical was                         performed, and patient medications and allergies were                         reviewed. The patient is competent. The risks and                         benefits of the procedure and the sedation options and                         risks were discussed with the patient. All questions                         were answered and informed consent was obtained.                         Patient identification and proposed procedure were                         verified by the physician, the nurse, the                         anesthesiologist, the anesthetist and the technician                         in the pre-procedure area in the procedure room in the                         endoscopy suite. Mental Status Examination: alert and                         oriented. Airway Examination: normal oropharyngeal                         airway and neck mobility. Respiratory Examination:  clear to auscultation. CV Examination: normal.                         Prophylactic Antibiotics: The patient does not require                          prophylactic antibiotics. Prior Anticoagulants: The                         patient has taken no previous anticoagulant or                         antiplatelet agents. ASA Grade Assessment: II - A                         patient with mild systemic disease. After reviewing                         the risks and benefits, the patient was deemed in                         satisfactory condition to undergo the procedure. The                         anesthesia plan was to use general anesthesia.                         Immediately prior to administration of medications,                         the patient was re-assessed for adequacy to receive                         sedatives. The heart rate, respiratory rate, oxygen                         saturations, blood pressure, adequacy of pulmonary                         ventilation, and response to care were monitored                         throughout the procedure. The physical status of the                         patient was re-assessed after the procedure.                        After obtaining informed consent, the colonoscope was                         passed under direct vision. Throughout the procedure,                         the patient's blood pressure, pulse, and oxygen                         saturations were monitored continuously. The  Colonoscope was introduced through the anus and                         advanced to the the cecum, identified by appendiceal                         orifice and ileocecal valve. The colonoscopy was                         performed without difficulty. The patient tolerated                         the procedure well. The quality of the bowel                         preparation was evaluated using the BBPS [facility] Bowel                         Preparation Scale) with scores of: Right Colon = 2                         (minor amount of residual staining, small fragments of                          stool and/or opaque liquid, but mucosa seen well),                         Transverse Colon = 3 (entire mucosa seen well with no                         residual staining, small fragments of stool or opaque                         liquid) and Left Colon = 3 (entire mucosa seen well                         with no residual staining, small fragments of stool or                         opaque liquid). The total BBPS score equals 8. The                         quality of the bowel preparation was adequate to                         identify polyps 6 mm and larger in size. Findings:      Large Skin tags were found on perianal exam.      Extensive amounts of semi-liquid stool was found in the ascending colon       and in the cecum, precluding visualization. Lavage of the area was       performed using a moderate amount of sterile water, resulting in       clearance with fair visualization.      The retroflexed view of the distal rectum and anal verge was normal and       showed no anal or rectal abnormalities.  A single postbanding (solitary) three mm ulcer was found in the rectum.       No bleeding was present. No stigmata of recent bleeding were seen.      The exam was otherwise without abnormality. Impression:            - Perianal skin tags found on perianal exam.                        - Stool in the ascending colon and in the cecum.                        - The distal rectum and anal verge are normal on                         retroflexion view.                        - A single (solitary) ulcer in the rectum.                        - The examination was otherwise normal.                        - No specimens collected. Recommendation:        - Discharge patient to home (with escort).                        - Resume previous diet today.                        - Continue present medications.                        - Repeat colonoscopy in 5 years for screening purposes                          due to fair prep in right colon. Procedure Code(s):     --- Professional ---                        [id number], Colorectal cancer screening; colonoscopy on                         individual not meeting criteria for high risk Diagnosis Code(s):     --- Professional ---                        Z12.11, Encounter for screening for malignant neoplasm                         of colon                        K62.6, Ulcer of anus and rectum                        K64.4, Residual hemorrhoidal skin tags CPT copyright 2019 American Medical Association. All rights reserved. The codes documented in this report are preliminary and upon coder review may  be revised to meet current compliance requirements. Dr. Ulyess Mort Vaishali Baise Raeanne Gathers  MD, MD 06/06/2021 10:02:48 AM This report has been signed electronically. Number of Addenda: 0 Note Initiated On: 06/06/2021 9:38 AM Scope Withdrawal Time: 0 hours 8 minutes 53 seconds  Total Procedure Duration: 0 hours 11 minutes 28 seconds  Estimated Blood Loss:  Estimated blood loss: none.      [facility]

## 2021-06-06 NOTE — Transfer of Care (Signed)
Immediate Anesthesia Transfer of Care Note  Patient: Julie Fry  Procedure(s) Performed: COLONOSCOPY WITH PROPOFOL  Patient Location: PACU  Anesthesia Type:General  Level of Consciousness: sedated  Airway & Oxygen Therapy: Patient Spontanous Breathing  Post-op Assessment: Report given to RN and Post -op Vital signs reviewed and stable  Post vital signs: Reviewed and stable  Last Vitals:  Vitals Value Taken Time  BP 82/45 06/06/21 1003  Temp    Pulse 62 06/06/21 1004  Resp 15 06/06/21 1004  SpO2 100 % 06/06/21 1004  Vitals shown include unvalidated device data.  Last Pain:  Vitals:   06/06/21 0858  TempSrc: Temporal  PainSc: 0-No pain         Complications: No notable events documented.

## 2021-06-07 ENCOUNTER — Encounter: Payer: Self-pay | Admitting: Gastroenterology

## 2021-07-17 ENCOUNTER — Ambulatory Visit (INDEPENDENT_AMBULATORY_CARE_PROVIDER_SITE_OTHER): Payer: BC Managed Care – PPO | Admitting: Family Medicine

## 2021-07-17 ENCOUNTER — Encounter: Payer: Self-pay | Admitting: Family Medicine

## 2021-07-17 ENCOUNTER — Other Ambulatory Visit: Payer: Self-pay

## 2021-07-17 VITALS — BP 116/72 | HR 75 | Temp 98.1°F | Resp 16 | Ht 59.0 in | Wt 111.4 lb

## 2021-07-17 DIAGNOSIS — K644 Residual hemorrhoidal skin tags: Secondary | ICD-10-CM | POA: Insufficient documentation

## 2021-07-17 DIAGNOSIS — Z30011 Encounter for initial prescription of contraceptive pills: Secondary | ICD-10-CM

## 2021-07-17 LAB — POCT URINE PREGNANCY: Preg Test, Ur: NEGATIVE

## 2021-07-17 MED ORDER — NORGESTIMATE-ETH ESTRADIOL 0.25-35 MG-MCG PO TABS: 1.0000 | ORAL_TABLET | Freq: Every day | ORAL | 11 refills | Status: DC

## 2021-07-17 MED ORDER — IBUPROFEN 600 MG PO TABS: 600.0000 mg | ORAL_TABLET | Freq: Three times a day (TID) | ORAL | 0 refills | Status: DC | PRN

## 2021-07-17 NOTE — Progress Notes (Signed)
    SUBJECTIVE:   CHIEF COMPLAINT / HPI:   ANAL DISCOMFORT - h/o hemorrhoid prolapse s/p banding x3 with GI. Recent colonoscopy 05/2021 with single rectal ulcer from recent banding, otherwise no abnormalities. - reports feeling like insides coming out. Feels like she has to push tissue back inside when walking or after defecating.  - denies pain with defecation, hip pain or abdominal pain - no difficulties urinating - no blood in urine or stool, vaginal discharge, N/V, dysmenorrhea, dyspareunia, fevers, h/o STI  Contraception Management - [id number] - Menses: regular, LMP 07/02/21 - Contraception: none currently. Previously on patch but it itched, wants to switch - currently sexually active - Cancer screening: UTD 12/2020   OBJECTIVE:   BP 116/72   Pulse 75   Temp 98.1 F (36.7 C) (Oral)   Resp 16   Ht 4\' 11"  (1.499 m)   Wt 111 lb 6.4 oz (50.5 kg)   SpO2 98%   BMI 22.50 kg/m   Gen: well appearing, in NAD Rectal exam: no tenderness noted, sphincter tone normal. No internal or external hemorrhoids appreciated. Significant anal skin tags.    ASSESSMENT/PLAN:   Anal skin tag Refer to surgery for removal.    Contraception Management Upreg negative. Rx sent for OCP.  Myles Gip, DO

## 2021-07-17 NOTE — Assessment & Plan Note (Signed)
Refer to surgery for removal.

## 2021-07-17 NOTE — Patient Instructions (Signed)
?????????????????????????!  ????????????????????????????: - ????????????????????????????. - ??????????????????????????? ????????????????????????????????. ????????????????????????????????????????????, ?????????????????????????????????????   MyChart ???????.  ????????????????????????????????????????????????????????????????????.  Dr. Ky Barban

## 2021-07-31 ENCOUNTER — Ambulatory Visit (INDEPENDENT_AMBULATORY_CARE_PROVIDER_SITE_OTHER): Payer: BC Managed Care – PPO | Admitting: Surgery

## 2021-07-31 ENCOUNTER — Encounter: Payer: Self-pay | Admitting: Surgery

## 2021-07-31 ENCOUNTER — Other Ambulatory Visit: Payer: Self-pay

## 2021-07-31 VITALS — BP 120/65 | HR 67 | Temp 98.3°F | Ht 59.0 in | Wt 113.0 lb

## 2021-07-31 DIAGNOSIS — K644 Residual hemorrhoidal skin tags: Secondary | ICD-10-CM

## 2021-07-31 NOTE — Patient Instructions (Signed)
Please call if you have any questions or concerns.    Metamucil of Fiber gummies.    Advised to pursue a goal of 25 to 30 g of fiber daily.  Made aware that the majority of this may be through natural sources, but advised to be aware of actual consumption and to ensure minimal consumption by daily supplementation.  Various forms of supplements discussed.  Strongly advised to consume more fluids to ensure adequate hydration, instructed to watch color of urine to determine adequacy of hydration.  Clarity is pursued in urine output, and bowel activity that correlates to significant meal intake.  Patient is to avoid deferring having bowel movements, advised to take the time at the first sign of sensation, typically following meals and in the morning.  Subsequent utilization of MiraLAX to ensure at least daily movement, ideally twice daily bowel movements.  If multiple doses of MiraLAX are necessary utilize them.  Psyllium oral capsule What is this medication? PSYLLIUM (SIL i yum) is a bulk-forming fiber laxative. This medicine is used to treat constipation. Increasing fiber in the diet may also help lower cholesterol and promote heart health for some people. This medicine may be used for other purposes; ask your health care provider or pharmacist if you have questions. COMMON BRAND NAME(S): GenFiber, Konsyl, Metamucil, Metamucil MultiHealth, Natural Fiber Laxative, Reguloid What should I tell my care team before I take this medication? They need to know if you have any of these conditions: blockage in your bowel difficulty swallowing inflammatory bowel disease stomach or intestine problems sudden change in bowel habits lasting more than 2 weeks an unusual or allergic reaction to psyllium, other medicines, dyes, or preservatives pregnant or trying or get pregnant breast-feeding How should I use this medication? Take this medicine by mouth with a full glass of water. Follow the directions on the  package labeling, or take as directed by your health care professional. Take your medicine at regular intervals. Do not take your medicine more often than directed. Talk to your pediatrician regarding the use of this medicine in children. While this drug may be prescribed for children as young as 67 years of age for selected conditions, precautions do apply. Overdosage: If you think you have taken too much of this medicine contact a poison control center or emergency room at once. NOTE: This medicine is only for you. Do not share this medicine with others. What if I miss a dose? If you miss a dose, take it as soon as you can. If it is almost time for your next dose, take only that dose. Do not take double or extra doses. What may interact with this medication? Interactions are not expected. Take this product at least 2 hours before or after other medicines. This list may not describe all possible interactions. Give your health care provider a list of all the medicines, herbs, non-prescription drugs, or dietary supplements you use. Also tell them if you smoke, drink alcohol, or use illegal drugs. Some items may interact with your medicine. What should I watch for while using this medication? Check with your doctor or health care professional if your symptoms do not start to get better or if they get worse. Stop using this medicine and contact your doctor or health care professional if you have rectal bleeding or if you have to treat your constipation for more than 1 week. These could be signs of a more serious condition. Drink several glasses of water a day while you are taking  this medicine. This will help to relieve constipation and prevent dehydration. What side effects may I notice from receiving this medication? Side effects that you should report to your doctor or health care professional as soon as possible: allergic reactions like skin rash, itching or hives, swelling of the face, lips, or  tongue breathing problems chest pain nausea, vomiting rectal bleeding trouble swallowing Side effects that usually do not require medical attention (report to your doctor or health care professional if they continue or are bothersome): bloating gas stomach cramps This list may not describe all possible side effects. Call your doctor for medical advice about side effects. You may report side effects to FDA at 1-800-FDA-1088. Where should I keep my medication? Keep out of the reach of children. Store at room temperature between 15 and 30 degrees C (59 and 86 degrees F). Protect from moisture. Throw away any unused medicine after the expiration date. NOTE: This sheet is a summary. It may not cover all possible information. If you have questions about this medicine, talk to your doctor, pharmacist, or health care provider.  2022 Elsevier/Gold Standard (2018-02-03 00:00:00) Constipation, Adult Constipation is when a person has fewer than three bowel movements in a week, has difficulty having a bowel movement, or has stools (feces) that are dry, hard, or larger than normal. Constipation may be caused by an underlying condition. It may become worse with age if a person takes certain medicines and does not take in enough fluids. Follow these instructions at home: Eating and drinking  Eat foods that have a lot of fiber, such as beans, whole grains, and fresh fruits and vegetables. Limit foods that are low in fiber and high in fat and processed sugars, such as fried or sweet foods. These include french fries, hamburgers, cookies, candies, and soda. Drink enough fluid to keep your urine pale yellow. General instructions Exercise regularly or as told by your health care provider. Try to do 150 minutes of moderate exercise each week. Use the bathroom when you have the urge to go. Do not hold it in. Take over-the-counter and prescription medicines only as told by your health care provider. This  includes any fiber supplements. During bowel movements: Practice deep breathing while relaxing the lower abdomen. Practice pelvic floor relaxation. Watch your condition for any changes. Let your health care provider know about them. Keep all follow-up visits as told by your health care provider. This is important. Contact a health care provider if: You have pain that gets worse. You have a fever. You do not have a bowel movement after 4 days. You vomit. You are not hungry or you lose weight. You are bleeding from the opening between the buttocks (anus). You have thin, pencil-like stools. Get help right away if: You have a fever and your symptoms suddenly get worse. You leak stool or have blood in your stool. Your abdomen is bloated. You have severe pain in your abdomen. You feel dizzy or you faint. Summary Constipation is when a person has fewer than three bowel movements in a week, has difficulty having a bowel movement, or has stools (feces) that are dry, hard, or larger than normal. Eat foods that have a lot of fiber, such as beans, whole grains, and fresh fruits and vegetables. Drink enough fluid to keep your urine pale yellow. Take over-the-counter and prescription medicines only as told by your health care provider. This includes any fiber supplements. This information is not intended to replace advice given to you by  your health care provider. Make sure you discuss any questions you have with your health care provider. Document Revised: 07/28/2019 Document Reviewed: 07/28/2019 Elsevier Patient Education  2022 [employer].   May want to use a [place] Continue to clean your self well after every bowel movement.

## 2021-07-31 NOTE — Progress Notes (Signed)
Patient ID: Julie Fry, female   DOB: 11-15-1975, 45 y.o.   MRN: [id number]  Chief Complaint: Anal skin tags and hygiene  History of Present Illness Julie Fry is present, there is utilization of her phone for  [place] interpreter. Julie Fry is a 45 y.o. female with anal skin tags present since delivery of her last child July 2019. She denies any particular pain, inflammatory changes.  She reports issues are primarily hygiene.  When discussing her bowel activity, she admits to skipping a day or days without bowel movement.  She reports significant irregularity in her bowel habits. She denies any pain with bowel activity.  Just the annoyance of post defecation hygiene.   Past Medical History Past Medical History:  Diagnosis Date   Medical history non-contributory       Past Surgical History:  Procedure Laterality Date   COLONOSCOPY WITH PROPOFOL N/A 06/06/2021   Procedure: COLONOSCOPY WITH PROPOFOL;  Surgeon: Lin Landsman, MD;  Location: [facility] ENDOSCOPY;  Service: Gastroenterology;  Laterality: N/A;   DILATION AND EVACUATION N/A 08/07/2015   Procedure: DILATATION AND EVACUATION;  Surgeon: Boykin Nearing, MD;  Location: [facility] ORS;  Service: Gynecology;  Laterality: N/A;    No Known Allergies  Current Outpatient Medications  Medication Sig Dispense Refill   ibuprofen (ADVIL) 600 MG tablet Take 1 tablet (600 mg total) by mouth every 8 (eight) hours as needed. 30 tablet 0   norgestimate-ethinyl estradiol (ORTHO-CYCLEN) 0.25-35 MG-MCG tablet Take 1 tablet by mouth daily. 28 tablet 11   No current facility-administered medications for this visit.    Family History History reviewed. No pertinent family history.    Social History Social History   Tobacco Use   Smoking status: Never   Smokeless tobacco: Never  Vaping Use   Vaping Use: Never used  Substance Use Topics   Alcohol use: No    Alcohol/week: 1.0 standard drink    Types: 1 Glasses of wine  per week   Drug use: No        Review of Systems  All other systems reviewed and are negative.    Physical Exam Blood pressure 120/65, pulse 67, temperature 98.3 F (36.8 C), temperature source Oral, height 4\' 11"  (1.499 m), weight 113 lb (51.3 kg), SpO2 97 %, unknown if currently breastfeeding. Last Weight  Most recent update: 07/31/2021  2:47 PM    Weight  51.3 kg (113 lb)             CONSTITUTIONAL: Well developed, and nourished, appropriately responsive and aware without distress.   EYES: Sclera non-icteric.   EARS, NOSE, MOUTH AND THROAT: Mask worn.   The oropharynx is clear. Oral mucosa is pink and moist.    Hearing is intact to voice.  NECK: Trachea is midline, and there is no jugular venous distension.  LYMPH NODES:  Lymph nodes in the neck are not enlarged. RESPIRATORY:  Lungs are clear, and breath sounds are equal bilaterally. Normal respiratory effort without pathologic use of accessory muscles. CARDIOVASCULAR: Heart is regular in rate and rhythm. GI: The abdomen is  soft, nontender, and nondistended. There were no palpable masses. I did not appreciate hepatosplenomegaly. There were normal bowel sounds. GU: Soft minimally enlarged perianal skin tags.  No evidence of inflammatory changes, discoloration or ulceration.  Digital exam with circumferential grade 1 internal hemorrhoids without redundancy, no distal rectal mucosal abnormality.  No evidence of fissure or fistula present. MUSCULOSKELETAL:  Symmetrical muscle tone appreciated in all four extremities.  SKIN: Skin turgor is normal. No pathologic skin lesions appreciated.  NEUROLOGIC:  Motor and sensation appear grossly normal.  Cranial nerves are grossly without defect. PSYCH:  Alert and oriented to person, place and time. Affect is appropriate for situation.  Data Reviewed I have personally reviewed what is currently available of the patient's imaging, recent labs and medical records.   Labs:  CBC Latest Ref  Rng & Units 01/22/2021 01/04/2021 04/13/2018  WBC 3.8 - 10.8 Thousand/uL 5.8 17.7(H) 16.6(H)  Hemoglobin 11.7 - 15.5 g/dL 12.6 12.8 11.5(L)  Hematocrit 35.0 - 45.0 % 38.9 40.0 33.9(L)  Platelets 140 - 400 Thousand/uL 261 225 183   CMP Latest Ref Rng & Units 08/07/2015  Glucose 65 - 99 mg/dL 97  BUN 6 - 20 mg/dL 8  Creatinine 0.44 - 1.00 mg/dL 0.45  Sodium 135 - 145 mmol/L 136  Potassium 3.5 - 5.1 mmol/L 3.5  Chloride 101 - 111 mmol/L 105  CO2 22 - 32 mmol/L 28  Calcium 8.9 - 10.3 mg/dL 9.3      Imaging:  Within last 24 hrs: No results found.  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  They are quite small and would likely create more pain and scarring for her should this ever be attempted.  I advised that she start a fiber rich diet including fiber supplementation. Advised to pursue a goal of 25 to 30 g of fiber daily.  Made aware that the majority of this may be through natural sources, but advised to be aware of actual consumption and to ensure minimal consumption by daily supplementation.  Various forms of supplements discussed.  Strongly advised to consume more fluids to ensure adequate hydration, instructed to watch color of urine to determine adequacy of hydration.  Clarity is pursued in urine output, and bowel activity that correlates to significant meal intake.  Patient is to avoid deferring having bowel movements, advised to take the time at the first sign of sensation, typically following meals and in the morning.  Subsequent utilization of MiraLAX to ensure at least daily  movement, ideally twice daily bowel movements.  If multiple doses of MiraLAX are necessary utilize them.  This should help prevent these hemorrhoidal tags from getting any worse or her internal hemorrhoids from progressing.  I have advised that she utilize a bidet or hand-held shower to help with hygienic cleansing of this area after bowel activity.  I suspect more frequent smaller movements may be much easier to manage.  Face-to-face time spent with the patient and accompanying care providers(if present) was 45 minutes, with more than 50% of the time spent counseling, educating, and coordinating care of the patient.    These notes generated with voice recognition software. I apologize for typographical errors.  Ronny Bacon M.D., FACS 07/31/2021, 3:20 PM

## 2022-01-17 ENCOUNTER — Ambulatory Visit (INDEPENDENT_AMBULATORY_CARE_PROVIDER_SITE_OTHER): Payer: BC Managed Care – PPO | Admitting: Dermatology

## 2022-01-17 ENCOUNTER — Ambulatory Visit: Payer: BC Managed Care – PPO | Admitting: Dermatology

## 2022-01-17 DIAGNOSIS — L811 Chloasma: Secondary | ICD-10-CM

## 2022-01-17 NOTE — Patient Instructions (Addendum)
Instructions for [facility] Medications ? ?One or more of your medications was sent to the [facility] mail order compounding pharmacy. You will receive an email from them and can purchase the medicine through that link. It will then be mailed to your home at the address you confirmed. If for any reason you do not receive an email from them, please check your spam folder. If you still do not find the email, please let us know. [facility] phone number is [phone]. ? ?If You Need Anything After Your Visit ? ?If you have any questions or concerns for your doctor, please call our main line at [phone] and press option 4 to reach your doctor's medical assistant. If no one answers, please leave a voicemail as directed and we will return your call as soon as possible. Messages left after 4 pm will be answered the following business day.  ? ?You may also send [place] a message via MyChart. We typically respond to MyChart messages within 1-2 business days. ? ?For prescription refills, please ask your pharmacy to contact our office. Our fax number is [phone]. ? ?If you have an urgent issue when the clinic is closed that cannot wait until the next business day, you can page your doctor at the number below.   ? ?Please note that while we do our best to be available for urgent issues outside of office hours, we are not available 24/7.  ? ?If you have an urgent issue and are unable to reach [place], you may choose to seek medical care at your doctor's office, retail clinic, urgent care center, or emergency room. ? ?If you have a medical emergency, please immediately call 911 or go to the emergency department. ? ?Pager Numbers ? ?- Dr. Nehemiah Massed: [phone] ? ?- Dr. Laurence Ferrari: [phone] ? ?- Dr. Nicole Kindred: [phone] ? ?In the event of inclement weather, please call our main line at [phone] for an update on the status of any delays or closures. ? ?Dermatology Medication Tips: ?Please keep the boxes that  topical medications come in in order to help keep track of the instructions about where and how to use these. Pharmacies typically print the medication instructions only on the boxes and not directly on the medication tubes.  ? ?If your medication is too expensive, please contact our office at [phone] option 4 or send [place] a message through [place].  ? ?We are unable to tell what your co-pay for medications will be in advance as this is different depending on your insurance coverage. However, we may be able to find a substitute medication at lower cost or fill out paperwork to get insurance to cover a needed medication.  ? ?If a prior authorization is required to get your medication covered by your insurance company, please allow [place] 1-2 business days to complete this process. ? ?Drug prices often vary depending on where the prescription is filled and some pharmacies may offer cheaper prices. ? ?The website www.goodrx.com contains coupons for medications through different pharmacies. The prices here do not account for what the cost may be with help from insurance (it may be cheaper with your insurance), but the website can give you the price if you did not use any insurance.  ?- You can print the associated coupon and take it with your prescription to the pharmacy.  ?- You may also stop by our office during regular business hours and pick up a GoodRx coupon card.  ?- If you need your prescription sent electronically  to a different pharmacy, notify our office through [facility] or by phone at [phone] option 4. ? ? ? ? ?Si Usted Necesita Algo Despu?s de Su Visita ? ?Tambi?n puede enviarnos un mensaje a trav?s de MyChart. Por lo general respondemos a los mensajes de MyChart en el transcurso de 1 a 2 d?as h?biles. ? ?Para renovar recetas, por favor pida a su farmacia que se ponga en contacto con nuestra oficina. Nuestro n?mero de fax es el [phone]. ? ?Si tiene un asunto urgente cuando la cl?nica  est? cerrada y que no puede esperar hasta el siguiente d?a h?bil, puede llamar/localizar a su doctor(a) al n?mero que aparece a continuaci?n.  ? ?Por favor, tenga en cuenta que aunque hacemos todo lo posible para estar disponibles para asuntos urgentes fuera del horario de oficina, no estamos disponibles las 24 horas del d?a, los 7 d?as de la semana.  ? ?Si tiene un problema urgente y no puede comunicarse con nosotros, puede optar por buscar atenci?n m?dica  en el consultorio de su doctor(a), en una cl?nica privada, en un centro de atenci?n urgente o en una sala de emergencias. ? ?Si tiene Engineer, maintenance (IT) m?dica, por favor llame inmediatamente al 911 o vaya a la sala de emergencias. ? ?N?meros de b?per ? ?- Dr. Nehemiah Massed: [phone] ? ?- Dra. Moye: [phone] ? ?- Dra. Nicole Kindred: [phone] ? ?En caso de inclemencias del tiempo, por favor llame a nuestra l?nea principal al [phone] para una actualizaci?n sobre el estado de cualquier retraso o cierre. ? ?Consejos para la medicaci?n en dermatolog?a: ?Por favor, guarde las cajas en las que vienen los medicamentos de uso t?pico para ayudarle a seguir las instrucciones sobre d?nde y c?mo usarlos. Las farmacias generalmente imprimen las instrucciones del medicamento s?lo en las cajas y no directamente en los tubos del [place].  ? ?Si su medicamento es muy caro, por favor, p?ngase en contacto con Zigmund Daniel llamando al [phone] y presione la opci?n 4 o env?enos un mensaje a trav?s de MyChart.  ? ?No podemos decirle cu?l ser? su copago por los medicamentos por adelantado ya que esto es diferente dependiendo de la cobertura de su seguro. Sin embargo, es posible que podamos encontrar un medicamento sustituto a Electrical engineer un formulario para que el seguro cubra el medicamento que se considera necesario.  ? ?Si se requiere Ardelia Mems autorizaci?n previa para que su compa??a de seguros [place] su medicamento, por favor perm?tanos de 1 a 2 d?as h?biles para  completar este proceso. ? ?Los precios de los medicamentos var?an con frecuencia dependiendo del Environmental consultant de d?nde se surte la receta y alguna farmacias pueden ofrecer precios m?s baratos. ? ?El sitio web www.goodrx.com tiene cupones para medicamentos de Airline pilot. Los precios aqu? no tienen en cuenta lo que podr?a costar con la ayuda del seguro (puede ser m?s barato con su seguro), pero el sitio web puede darle el precio si no utiliz? ning?n seguro.  ?- Puede imprimir el cup?n correspondiente y llevarlo con su receta a la farmacia.  ?- Tambi?n puede pasar por nuestra oficina durante el horario de atenci?n regular y recoger una tarjeta de cupones de GoodRx.  ?- Si necesita que su receta se env?e electr?nicamente a Chiropodist, informe a nuestra oficina a trav?s de MyChart de [place] o por tel?fono llamando al [phone] y presione la opci?n 4. ? ?

## 2022-01-17 NOTE — Progress Notes (Signed)
? ?  Follow-Up Visit ?  ?Subjective  ?Julie Fry is a 46 y.o. female who presents for the following: Other (Dark spots of face - started after the birth of her first child 20 years ago). ?Husband translating on phone. ? ?The following portions of the chart were reviewed this encounter and updated as appropriate:  ? Tobacco  Allergies  Meds  Problems  Med Hx  Surg Hx  Fam Hx   ?  ?Review of Systems:  No other skin or systemic complaints except as noted in HPI or Assessment and Plan. ? ?Objective  ?Well appearing patient in no apparent distress; mood and affect are within normal limits. ? ?A focused examination was performed including the face. Relevant physical exam findings are noted in the Assessment and Plan. ? ?Scalp ?Reticulated hyperpigmented patches.  ? ? ? ? ? ? ? ? ? ? ? ? ? ? ? ?Assessment & Plan  ?Melasma ?Face ?See photos ?Started 20 years ago after birth of first child - patient is on OBC (which has a contraindication to tranexamic acid) ? ?Melasma is a chronic; persistent condition of hyperpigmented patches generally on the face, worse in summer due to higher UV exposure.    ?Heredity; thyroid disease; sun exposure; pregnancy; birth control pills; epilepsy medication and darker skin may predispose to Melasma.   ?Recommend Sun avoidance and daily broad spectrum (UVA/UVB) sunscreen SPF 30+, preferably with Zinc or Titanium Dioxide. ?Rx topical bleaching creams (i.e. hydroquinone) is a common treatment but should not be used long term.  Hydroquinones may be mixed with retinoids; steroids; Kojic Acid. ?Rx Azelaic Acid is also a treatment option that is safe for pregnancy (Category B). ?OTC Heliocare can be helpful in control and prevention. ?Oral Rx with Tranexamic Acid 650 mg once or twice daily can be used for moderate to severe cases especially during summer (contraindications include pregnancy (not pregnant or planning on becoming pregnant); lactation (not lactating); hx of PE; DVT;  clotting disorder (no hx of clotting disorder); heart disease (pt reports no heart issues); anticoagulant use and upcoming long trips (patient is not going on any long trips)) OTC HelioCare supplement, chemical peels (would need multiple for best result).  ?Lasers and Chemical Peels and Microdermabrasion may also be helpful adjunct treatments. ? ?Start [facility] mix QD x 3 mths -  ?Hydroquinone: 12% ?Kojic Acid: 6% ?Niacinamide: 2% ?Vitamin C: 1% ?Vehicle: Cream ? ?Sunscreen and sun protection reviewed. ? ?Return in about 3 months (around 04/18/2022) for melasma follow up . Patient would also like a lesion under her right eye removed at follow up visit. ? ?I, Rudell Cobb, CMA, am acting as scribe for Sarina Ser, MD . ?Documentation: I have reviewed the above documentation for accuracy and completeness, and I agree with the above. ? ?Sarina Ser, MD ? ? ?

## 2022-01-29 ENCOUNTER — Encounter: Payer: Self-pay | Admitting: Dermatology

## 2022-04-18 ENCOUNTER — Ambulatory Visit (INDEPENDENT_AMBULATORY_CARE_PROVIDER_SITE_OTHER): Payer: BC Managed Care – PPO | Admitting: Dermatology

## 2022-04-18 DIAGNOSIS — L811 Chloasma: Secondary | ICD-10-CM | POA: Diagnosis not present

## 2022-04-18 NOTE — Progress Notes (Signed)
Follow-Up Visit   Subjective  Julie Fry is a 46 y.o. female who presents for the following: melasma (3 month melasma follow up. Patient reports she has seen some improvement while using skin medicinals treatment for melasma. Also reports she has discontinued her birth control. ). The patient has spots, moles and lesions to be evaluated, some may be new or changing and the patient has concerns that these could be cancer.  The following portions of the chart were reviewed this encounter and updated as appropriate:  Tobacco  Allergies  Meds  Problems  Med Hx  Surg Hx  Fam Hx     Review of Systems: No other skin or systemic complaints except as noted in HPI or Assessment and Plan.  Objective  Well appearing patient in no apparent distress; mood and affect are within normal limits.  A focused examination was performed including head, including the scalp, face, neck, nose, ears, eyelids, and lips. Relevant physical exam findings are noted in the Assessment and Plan.  face Reticulated hyperpigmented patches.           Assessment & Plan  Melasma face See photos.  Compared to previous photos. Improved compared to previous photos  Chronic and persistent condition with duration or expected duration over one year. Condition is symptomatic / bothersome to patient. Not to goal.  Started 20 years ago after birth of first child -patient was on oral birth control pills last visit (which may have also been contributing to her melasma), but patient has recently discontinued oral birth control pills OBC (oral birth control pills use is a contraindication to tranexamic acid)  Patient reports she has stopped BC pills   Melasma is a chronic; persistent condition of hyperpigmented patches generally on the face, worse in summer due to higher UV exposure.     Heredity; thyroid disease; sun exposure; pregnancy; birth control pills; epilepsy medication and darker skin may predispose to  Melasma.   Recommend Sun avoidance and daily broad spectrum (UVA/UVB) sunscreen SPF 30+, preferably with Zinc or Titanium Dioxide. Rx topical bleaching creams (i.e. hydroquinone) is a common treatment but should not be used long term.  Hydroquinones may be mixed with retinoids; steroids; Kojic Acid. Rx Azelaic Acid is also a treatment option that is safe for pregnancy (Category B). OTC Heliocare can be helpful in control and prevention. Oral Rx with Tranexamic Acid 650 mg once or twice daily can be used for moderate to severe cases especially during summer (contraindications include pregnancy (not pregnant or planning on becoming pregnant); lactation (not lactating); hx of PE; DVT; clotting disorder (no hx of clotting disorder); heart disease (pt reports no heart issues); anticoagulant use and upcoming long trips (patient is not going on any long trips)) OTC HelioCare supplement, chemical peels (would need multiple for best result).  Lasers and Chemical Peels and Microdermabrasion may also be helpful adjunct treatments.   Can restart in 6 weeks Sk Medicinals mix QD x 3 mths then stop after 3 months Hydroquinone: 12% Kojic Acid: 6% Niacinamide: 2% Vitamin C: 1% Vehicle: Cream  Discussed that since she is off oral birth control pills, that she could Start tranexamic acid 650 mg tab - take 1 pill po qd.  The patient denies any other contraindications to tranexamic acid, such as history of blood clot, heart disease or current control pill use or pregnancy or lactation.   We had a long discussion about treatment with Tranexamic acid.  The patient initially wanted to be treated with this,  but after long discussion she decided not to be treated with it. Patient deferred treatment at this time.  Recommend taking Heliocare sun protection supplement daily in sunny weather for additional sun protection. For maximum protection on the sunniest days, you can take up to 2 capsules of regular Heliocare OR take 1  capsule of Heliocare Ultra. For prolonged exposure (such as a full day in the sun), you can repeat your dose of the supplement 4 hours after your first dose. Heliocare can be purchased at [employer], at some [facility] or at [email or web address].   Return in about 6 months (around 10/19/2022) for melasma .  IRuthell Rummage, CMA, am acting as scribe for Sarina Ser, MD. Documentation: I have reviewed the above documentation for accuracy and completeness, and I agree with the above.  Sarina Ser, MD

## 2022-04-18 NOTE — Patient Instructions (Addendum)
Melasma is a chronic; persistent condition of hyperpigmented patches generally on the face, worse in summer due to higher UV exposure.    Heredity; thyroid disease; sun exposure; pregnancy; birth control pills; epilepsy medication and darker skin may predispose to Melasma.   Recommendations include: - Sun avoidance and daily broad spectrum (UVA/UVB) sunscreen SPF 30+, preferably with Zinc or Titanium Dioxide. - Rx topical bleaching creams (i.e. hydroquinone) is a common treatment but should not be used long term.  Hydroquinones may be mixed with retinoids; steroids; Kojic Acid. - Rx Azelaic Acid is also a treatment option that is safe for pregnancy (Category B). - OTC Heliocare can be helpful in control and prevention. - Oral Rx with Tranexamic Acid 250 mg - 650 mg po daily can be used for moderate to severe cases especially during summer (contraindications include pregnancy; lactation; hx of PE; DVT; clotting disorder; heart disease; anticoagulant use and upcoming long trips)   - Chemical peels (would need multiple for best result).  - Lasers and  Microdermabrasion may also be helpful adjunct treatments.  Can restart 6 weeks Sk Medicinals mix QD x 3 mths - Hydroquinone: 12% Kojic Acid: 6% Niacinamide: 2% Vitamin C: 1% Vehicle: Cream    Recommend taking Heliocare sun protection supplement daily in sunny weather for additional sun protection. For maximum protection on the sunniest days, you can take up to 2 capsules of regular Heliocare OR take 1 capsule of Heliocare Ultra. For prolonged exposure (such as a full day in the sun), you can repeat your dose of the supplement 4 hours after your first dose. Heliocare can be purchased at [employer], at some [facility] or at [email or web address].  Recommend taking Heliocare sun protection supplement daily in sunny weather for additional sun protection. For maximum protection on the sunniest days, you can take up to 2 capsules of regular Heliocare OR  take 1 capsule of Heliocare Ultra. For prolonged exposure (such as a full day in the sun), you can repeat your dose of the supplement 4 hours after your first dose. Heliocare can be purchased at [employer], at some [facility] or at [email or web address].    Due to recent changes in healthcare laws, you may see results of your pathology and/or laboratory studies on MyChart before the doctors have had a chance to review them. We understand that in some cases there may be results that are confusing or concerning to you. Please understand that not all results are received at the same time and often the doctors may need to interpret multiple results in order to provide you with the best plan of care or course of treatment. Therefore, we ask that you please give [place] 2 business days to thoroughly review all your results before contacting the office for clarification. Should we see a critical lab result, you will be contacted sooner.   If You Need Anything After Your Visit  If you have any questions or concerns for your doctor, please call our main line at [phone] and press option 4 to reach your doctor's medical assistant. If no one answers, please leave a voicemail as directed and we will return your call as soon as possible. Messages left after 4 pm will be answered the following business day.   You may also send [place] a message via [place]. We typically respond to MyChart messages within 1-2 business days.  For prescription refills, please ask your pharmacy to contact our office. Our fax number is [phone].  If you have an urgent  issue when the clinic is closed that cannot wait until the next business day, you can page your doctor at the number below.    Please note that while we do our best to be available for urgent issues outside of office hours, we are not available 24/7.   If you have an urgent issue and are unable to reach [place], you may choose to seek medical care at your doctor's office,  retail clinic, urgent care center, or emergency room.  If you have a medical emergency, please immediately call 911 or go to the emergency department.  Pager Numbers  - Dr. Nehemiah Massed: [phone]  - Dr. Laurence Ferrari: [phone]  - Dr. Nicole Kindred: [phone]  In the event of inclement weather, please call our main line at [phone] for an update on the status of any delays or closures.  Dermatology Medication Tips: Please keep the boxes that topical medications come in in order to help keep track of the instructions about where and how to use these. Pharmacies typically print the medication instructions only on the boxes and not directly on the medication tubes.   If your medication is too expensive, please contact our office at [phone] option 4 or send [place] a message through [place].   We are unable to tell what your co-pay for medications will be in advance as this is different depending on your insurance coverage. However, we may be able to find a substitute medication at lower cost or fill out paperwork to get insurance to cover a needed medication.   If a prior authorization is required to get your medication covered by your insurance company, please allow [place] 1-2 business days to complete this process.  Drug prices often vary depending on where the prescription is filled and some pharmacies may offer cheaper prices.  The website www.goodrx.com contains coupons for medications through different pharmacies. The prices here do not account for what the cost may be with help from insurance (it may be cheaper with your insurance), but the website can give you the price if you did not use any insurance.  - You can print the associated coupon and take it with your prescription to the pharmacy.  - You may also stop by our office during regular business hours and pick up a GoodRx coupon card.  - If you need your prescription sent electronically to a different pharmacy, notify our office through  [facility] or by phone at [phone] option 4.     Si Usted Necesita Algo Despus de Su Visita  Tambin puede enviarnos un mensaje a travs de Pharmacist, community. Por lo general respondemos a los mensajes de MyChart en el transcurso de 1 a 2 das hbiles.  Para renovar recetas, por favor pida a su farmacia que se ponga en contacto con nuestra oficina. Harland Dingwall de fax es [place] [phone].  Si tiene un asunto urgente cuando la clnica est cerrada y que no puede esperar hasta el siguiente da hbil, puede llamar/localizar a su doctor(a) al nmero que aparece a continuacin.   Por favor, tenga en cuenta que aunque hacemos todo lo posible para estar disponibles para asuntos urgentes fuera del horario de [place], no estamos disponibles las 24 horas del da, los 7 das de la [place].   Si tiene un problema urgente y no puede comunicarse con nosotros, puede optar por buscar atencin mdica  en el consultorio de su doctor(a), en una clnica privada, en un centro de atencin urgente o en una sala de emergencias.  Si tiene Engineering geologist, por favor llame inmediatamente al 911 o vaya a la sala de emergencias.  Nmeros de bper  - Dr. Nehemiah Massed: [phone]  - Dra. Moye: [phone]  - Dra. Nicole Kindred: [phone]  En caso de inclemencias del [place], por favor llame a Johnsie Kindred principal al [phone] para una actualizacin sobre el [place] de cualquier retraso o cierre.  Consejos para la medicacin en dermatologa: Por favor, guarde las cajas en las que vienen los medicamentos de uso tpico para ayudarle a seguir las instrucciones sobre dnde y cmo usarlos. Las farmacias generalmente imprimen las instrucciones del medicamento slo en las cajas y no directamente en los tubos del [place].   Si su medicamento es muy caro, por favor, pngase en contacto con Zigmund Daniel llamando al [phone] y presione la opcin 4 o envenos un mensaje a travs de Pharmacist, community.   No podemos  decirle cul ser su copago por los medicamentos por adelantado ya que esto es diferente dependiendo de la cobertura de su seguro. Sin embargo, es posible que podamos encontrar un medicamento sustituto a Electrical engineer un formulario para que el seguro cubra el medicamento que se considera necesario.   Si se requiere una autorizacin previa para que su compaa de seguros [place] su medicamento, por favor permtanos de 1 a 2 das hbiles para completar este proceso.  Los precios de los medicamentos varan con frecuencia dependiendo del Environmental consultant de dnde se surte la receta y alguna farmacias pueden ofrecer precios ms baratos.  El sitio web www.goodrx.com tiene cupones para medicamentos de Airline pilot. Los precios aqu no tienen en cuenta lo que podra costar con la ayuda del seguro (puede ser ms barato con su seguro), pero el sitio web puede darle el precio si no utiliz Research scientist (physical sciences).  - Puede imprimir el cupn correspondiente y llevarlo con su receta a la farmacia.  - Tambin puede pasar por nuestra oficina durante el horario de atencin regular y Charity fundraiser una tarjeta de cupones de GoodRx.  - Si necesita que su receta se enve electrnicamente a una farmacia diferente, informe a nuestra oficina a travs de MyChart de [place] o por telfono llamando al [phone] y presione la opcin 4.

## 2022-04-22 ENCOUNTER — Encounter: Payer: Self-pay | Admitting: Dermatology

## 2022-08-12 ENCOUNTER — Ambulatory Visit (INDEPENDENT_AMBULATORY_CARE_PROVIDER_SITE_OTHER): Payer: BC Managed Care – PPO | Admitting: Internal Medicine

## 2022-08-12 ENCOUNTER — Encounter: Payer: Self-pay | Admitting: Internal Medicine

## 2022-08-12 VITALS — BP 108/68 | HR 76 | Temp 98.0°F | Resp 16 | Ht 59.0 in | Wt 107.8 lb

## 2022-08-12 DIAGNOSIS — Z1231 Encounter for screening mammogram for malignant neoplasm of breast: Secondary | ICD-10-CM

## 2022-08-12 DIAGNOSIS — R102 Pelvic and perineal pain: Secondary | ICD-10-CM

## 2022-08-12 DIAGNOSIS — Z0000 Encounter for general adult medical examination without abnormal findings: Secondary | ICD-10-CM | POA: Diagnosis not present

## 2022-08-12 DIAGNOSIS — E559 Vitamin D deficiency, unspecified: Secondary | ICD-10-CM | POA: Diagnosis not present

## 2022-08-12 DIAGNOSIS — Z1159 Encounter for screening for other viral diseases: Secondary | ICD-10-CM

## 2022-08-12 DIAGNOSIS — Z13220 Encounter for screening for lipoid disorders: Secondary | ICD-10-CM | POA: Diagnosis not present

## 2022-08-12 MED ORDER — MELOXICAM 15 MG PO TABS: 15.0000 mg | ORAL_TABLET | Freq: Every day | ORAL | 1 refills | Status: DC

## 2022-08-12 NOTE — Patient Instructions (Signed)
It was great seeing you today!  Plan discussed at today's visit: -Blood work ordered today, results will be uploaded to [facility].  -Mammogram ordered for breast cancer screening -Mobic refilled to take as needed for pain/inflammation - pelvic bones are even and in place but still can cause pain, especially with walking and activity. Be sure to take medication with food.  Follow up in: 1 year  Take care and let us know if you have any questions or concerns prior to your next visit.  Dr. Rosana Berger

## 2022-08-12 NOTE — Progress Notes (Signed)
Established Patient Office Visit  Subjective   Patient ID: Julie Fry, female    DOB: [birth year]  Age: 46 y.o. MRN: [id number]  Chief Complaint  Patient presents with   Follow-up   Medication Refill    HPI  Patient is here for medication refills. She is here with her husband. 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 She has had pelvic pain since that time, worse with walking and being more active. She does take Mobic 15 mg as needed, 30 pills lasted 1 year for her. She also takes Tylenol as needed for pain. She did see PT after it first happened for about 2 months which she states did not really help with her pain. 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 No joint space narrowing or erosion. No fracture or dislocation.   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   Advanced maternal age in multigravida, second trimester    Supervision of other high risk pregnancies, third trimester 10/29/2017   Past Medical History:  Diagnosis Date   Medical history non-contributory    Past Surgical History:  Procedure Laterality Date   COLONOSCOPY WITH PROPOFOL N/A 06/06/2021   Procedure: COLONOSCOPY WITH PROPOFOL;  Surgeon: Lin Landsman, MD;  Location: [facility];  Service: Gastroenterology;  Laterality: N/A;   DILATION AND EVACUATION N/A 08/07/2015   Procedure: DILATATION AND EVACUATION;  Surgeon: Boykin Nearing, MD;  Location: [facility];  Service: Gynecology;  Laterality: N/A;   Social History   Tobacco Use   Smoking status: Never   Smokeless tobacco: Never  Vaping Use    Vaping Use: Never used  Substance Use Topics   Alcohol use: No    Alcohol/week: 1.0 standard drink of alcohol    Types: 1 Glasses of wine per week   Drug use: No   Social History   Socioeconomic History   Marital status: Single    Spouse name: Not on file   Number of children: Not on file   Years of education: Not on file   Highest education level: Not on file  Occupational History   Not on file  Tobacco Use   Smoking status: Never   Smokeless tobacco: Never  Vaping Use   Vaping Use: Never used  Substance and Sexual Activity   Alcohol use: No    Alcohol/week: 1.0 standard drink of alcohol    Types: 1 Glasses of wine per week   Drug use: No   Sexual activity: Not Currently    Partners: Male  Other Topics Concern   Not on file  Social History Narrative   Not on file   Social Determinants of Health   Financial Resource Strain: Low Risk  (01/04/2021)   Overall Financial Resource Strain (CARDIA)    Difficulty of Paying Living Expenses: Not hard at all  Food Insecurity: No Food Insecurity (01/04/2021)   Hunger Vital Sign    Worried About Running Out of Food in the Last Year: Never true    Ran Out of Food in the Last Year: Never true  Transportation Needs: No Transportation Needs (01/04/2021)   [place] -  Hydrologist (Medical): No    Lack of Transportation (Non-Medical): No  Physical Activity: Inactive (01/04/2021)   Exercise Vital Sign    Days of Exercise per Week: 0 days    Minutes of Exercise per Session: 0 min  Stress: No Stress Concern Present (01/04/2021)   [place]    Feeling of Stress : Not at all  Social Connections: Socially Isolated (01/04/2021)   Social Connection and Isolation Panel [NHANES]    Frequency of Communication with Friends and Family: More than three times a week    Frequency of Social Gatherings with Friends and Family: More than three times a week     Attends Religious Services: Never    Marine scientist or Organizations: No    Attends Archivist Meetings: Never    Marital Status: Never married  Intimate Partner Violence: Not At Risk (01/04/2021)   Humiliation, Afraid, Rape, and Kick questionnaire    Fear of Current or Ex-Partner: No    Emotionally Abused: No    Physically Abused: No    Sexually Abused: No   Family Status  Relation Name Status   Mother  Alive   Father  Deceased at age unknown       unknown cancer   Sister unknown Alive   Sister unknown 69   Sister unknown Alive   Sister unknown 3   Brother unknown Alive   Brother unknown Alive   Brother unknown Alive   History reviewed. No pertinent family history. No Known Allergies    Review of Systems  Musculoskeletal:  Positive for joint pain.      Objective:     BP 108/68   Pulse 76   Temp 98 F (36.7 C)   Resp 16   Ht '4\' 11"'$  (1.499 m)   Wt 107 lb 12.8 oz (48.9 kg)   SpO2 99%   BMI 21.77 kg/m  BP Readings from Last 3 Encounters:  08/12/22 108/68  07/31/21 120/65  07/17/21 116/72   Wt Readings from Last 3 Encounters:  08/12/22 107 lb 12.8 oz (48.9 kg)  07/31/21 113 lb (51.3 kg)  07/17/21 111 lb 6.4 oz (50.5 kg)      Physical Exam Constitutional:      Appearance: Normal appearance.  HENT:     Head: Normocephalic and atraumatic.     Mouth/Throat:     Mouth: Mucous membranes are moist.     Pharynx: Oropharynx is clear.  Eyes:     Conjunctiva/sclera: Conjunctivae normal.  Cardiovascular:     Rate and Rhythm: Normal rate and regular rhythm.  Pulmonary:     Effort: Pulmonary effort is normal.     Breath sounds: Normal breath sounds.  Musculoskeletal:        General: Tenderness present.     Right lower leg: No edema.     Left lower leg: No edema.     Comments: Pubic symphysis symmetrical but tender  Skin:    General: Skin is warm and dry.  Neurological:     General: No focal deficit present.     Mental Status: She  is alert. Mental status is at baseline.  Psychiatric:        Mood and Affect: Mood normal.        Behavior: Behavior normal.     No results found for any visits on 08/12/22.  Last CBC Lab Results  Component Value Date  WBC 5.8 01/22/2021   HGB 12.6 01/22/2021   HCT 38.9 01/22/2021   MCV 88.8 01/22/2021   MCH 28.8 01/22/2021   RDW 12.4 01/22/2021   PLT 261 14/97/0263   Last metabolic panel Lab Results  Component Value Date   GLUCOSE 97 08/07/2015   NA 136 08/07/2015   K 3.5 08/07/2015   CL 105 08/07/2015   CO2 28 08/07/2015   BUN 8 08/07/2015   CREATININE 0.45 08/07/2015   GFRNONAA >60 08/07/2015   CALCIUM 9.3 08/07/2015   ANIONGAP 3 (L) 08/07/2015   Last lipids Lab Results  Component Value Date   CHOL 188 01/04/2021   HDL 64 01/04/2021   LDLCALC 106 (H) 01/04/2021   TRIG 85 01/04/2021   CHOLHDL 2.9 01/04/2021   Last hemoglobin A1c Lab Results  Component Value Date   HGBA1C 5.4 01/04/2021   Last thyroid functions Lab Results  Component Value Date   TSH 1.01 01/04/2021   Last vitamin D Lab Results  Component Value Date   VD25OH 16 (L) 01/04/2021   Last vitamin B12 and Folate No results found for: "VITAMINB12", "FOLATE"    The 10-year ASCVD risk score (Arnett DK, et al., 2019) is: 0.5%    Assessment & Plan:   1. Annual physical exam/Vitamin D deficiency/Lipid screening/Need for hepatitis C screening test: Annual lab screening.   - CBC w/Diff/Platelet - COMPLETE METABOLIC PANEL WITH GFR - Lipid Profile - Vitamin D (25 hydroxy) - Hepatitis C Antibody  2. Pelvic pain: Xray from 2021 reviewed, discussed restarting PT but patient declines. Refill Mobic 15 mg as needed, discussed potential side effects.   - meloxicam (MOBIC) 15 MG tablet; Take 1 tablet (15 mg total) by mouth daily.  Dispense: 30 tablet; Refill: 1  3. Encounter for screening mammogram for malignant neoplasm of breast: Mammogram ordered.   - MM 3D SCREEN BREAST BILATERAL;  Future   Return in about 1 year (around 08/13/2023).    Teodora Medici, DO

## 2022-08-13 LAB — COMPLETE METABOLIC PANEL WITH GFR
AG Ratio: 1.2 (calc) (ref 1.0–2.5)
ALT: 13 U/L (ref 6–29)
AST: 17 U/L (ref 10–35)
Albumin: 4.3 g/dL (ref 3.6–5.1)
Alkaline phosphatase (APISO): 73 U/L (ref 31–125)
BUN: 10 mg/dL (ref 7–25)
CO2: 27 mmol/L (ref 20–32)
Calcium: 9.8 mg/dL (ref 8.6–10.2)
Chloride: 103 mmol/L (ref 98–110)
Creat: 0.57 mg/dL (ref 0.50–0.99)
Globulin: 3.7 g/dL (calc) (ref 1.9–3.7)
Glucose, Bld: 85 mg/dL (ref 65–99)
Potassium: 4.3 mmol/L (ref 3.5–5.3)
Sodium: 137 mmol/L (ref 135–146)
Total Bilirubin: 0.9 mg/dL (ref 0.2–1.2)
Total Protein: 8 g/dL (ref 6.1–8.1)
eGFR: 113 mL/min/{1.73_m2} (ref >=60)

## 2022-08-13 LAB — CBC WITH DIFFERENTIAL/PLATELET
Absolute Monocytes: 472 cells/uL (ref 200–950)
Basophils Absolute: 40 cells/uL (ref 0–200)
Basophils Relative: 0.5 %
Eosinophils Absolute: 288 cells/uL (ref 15–500)
Eosinophils Relative: 3.6 %
HCT: 41.3 % (ref 35.0–45.0)
Hemoglobin: 13.7 g/dL (ref 11.7–15.5)
Lymphs Abs: 2336 cells/uL (ref 850–3900)
MCH: 29 pg (ref 27.0–33.0)
MCHC: 33.2 g/dL (ref 32.0–36.0)
MCV: 87.3 fL (ref 80.0–100.0)
MPV: 10.7 fL (ref 7.5–12.5)
Monocytes Relative: 5.9 %
Neutro Abs: 4864 cells/uL (ref 1500–7800)
Neutrophils Relative %: 60.8 %
Platelets: 337 10*3/uL (ref 140–400)
RBC: 4.73 10*6/uL (ref 3.80–5.10)
RDW: 12.3 % (ref 11.0–15.0)
Total Lymphocyte: 29.2 %
WBC: 8 10*3/uL (ref 3.8–10.8)

## 2022-08-13 LAB — LIPID PANEL
Cholesterol: 213 mg/dL — ABNORMAL HIGH (ref <200)
HDL: 68 mg/dL (ref >=50)
LDL Cholesterol (Calc): 123 mg/dL (calc) — ABNORMAL HIGH
Non-HDL Cholesterol (Calc): 145 mg/dL (calc) — ABNORMAL HIGH (ref <130)
Total CHOL/HDL Ratio: 3.1 (calc) (ref <5.0)
Triglycerides: 109 mg/dL (ref <150)

## 2022-08-13 LAB — VITAMIN D 25 HYDROXY (VIT D DEFICIENCY, FRACTURES): Vit D, 25-Hydroxy: 16 ng/mL — ABNORMAL LOW (ref 30–100)

## 2022-08-13 LAB — HEPATITIS C ANTIBODY: Hepatitis C Ab: NONREACTIVE

## 2022-08-13 MED ORDER — VITAMIN D (ERGOCALCIFEROL) 1.25 MG (50000 UNIT) PO CAPS: 50000.0000 [IU] | ORAL_CAPSULE | ORAL | 0 refills | Status: DC

## 2022-08-13 NOTE — Addendum Note (Signed)
Addended by: Teodora Medici on: 08/13/2022 12:48 PM   Modules accepted: Orders

## 2022-10-21 ENCOUNTER — Encounter: Payer: Self-pay | Admitting: Dermatology

## 2022-10-21 ENCOUNTER — Ambulatory Visit (INDEPENDENT_AMBULATORY_CARE_PROVIDER_SITE_OTHER): Payer: BC Managed Care – PPO | Admitting: Dermatology

## 2022-10-21 VITALS — BP 122/80 | HR 76

## 2022-10-21 DIAGNOSIS — L811 Chloasma: Secondary | ICD-10-CM

## 2022-10-21 DIAGNOSIS — Z79899 Other long term (current) drug therapy: Secondary | ICD-10-CM

## 2022-10-21 NOTE — Patient Instructions (Signed)
Start: Azelaic Acid: 20%, Kojic Acid: 6%, Niacinamide: 4% cream twice daily to affected areas on face.  Instructions for [facility] Medications  One or more of your medications was sent to the [facility] mail order compounding pharmacy. You will receive an email from them and can purchase the medicine through that link. It will then be mailed to your home at the address you confirmed. If for any reason you do not receive an email from them, please check your spam folder. If you still do not find the email, please let us know. [facility] phone number is [phone].    Recommend daily broad spectrum sunscreen SPF 30+ to sun-exposed areas, reapply every 2 hours as needed. Call for new or changing lesions.  Staying in the shade or wearing long sleeves, sun glasses (UVA+UVB protection) and wide brim hats (4-inch brim around the entire circumference of the hat) are also recommended for sun protection.    Due to recent changes in healthcare laws, you may see results of your pathology and/or laboratory studies on MyChart before the doctors have had a chance to review them. We understand that in some cases there may be results that are confusing or concerning to you. Please understand that not all results are received at the same time and often the doctors may need to interpret multiple results in order to provide you with the best plan of care or course of treatment. Therefore, we ask that you please give [place] 2 business days to thoroughly review all your results before contacting the office for clarification. Should we see a critical lab result, you will be contacted sooner.   If You Need Anything After Your Visit  If you have any questions or concerns for your doctor, please call our main line at [phone] and press option 4 to reach your doctor's medical assistant. If no one answers, please leave a voicemail as directed and we will return your call as soon as possible. Messages left  after 4 pm will be answered the following business day.   You may also send [place] a message via [place]. We typically respond to MyChart messages within 1-2 business days.  For prescription refills, please ask your pharmacy to contact our office. Our fax number is [phone].  If you have an urgent issue when the clinic is closed that cannot wait until the next business day, you can page your doctor at the number below.    Please note that while we do our best to be available for urgent issues outside of office hours, we are not available 24/7.   If you have an urgent issue and are unable to reach [place], you may choose to seek medical care at your doctor's office, retail clinic, urgent care center, or emergency room.  If you have a medical emergency, please immediately call 911 or go to the emergency department.  Pager Numbers  - Dr. Nehemiah Massed: [phone]  - Dr. Laurence Ferrari: [phone]  - Dr. Nicole Kindred: [phone]  In the event of inclement weather, please call our main line at [phone] for an update on the status of any delays or closures.  Dermatology Medication Tips: Please keep the boxes that topical medications come in in order to help keep track of the instructions about where and how to use these. Pharmacies typically print the medication instructions only on the boxes and not directly on the medication tubes.   If your medication is too expensive, please contact our office at [phone] option 4 or  send [place] a message through [place].   We are unable to tell what your co-pay for medications will be in advance as this is different depending on your insurance coverage. However, we may be able to find a substitute medication at lower cost or fill out paperwork to get insurance to cover a needed medication.   If a prior authorization is required to get your medication covered by your insurance company, please allow [place] 1-2 business days to complete this process.  Drug prices often  vary depending on where the prescription is filled and some pharmacies may offer cheaper prices.  The website www.goodrx.com contains coupons for medications through different pharmacies. The prices here do not account for what the cost may be with help from insurance (it may be cheaper with your insurance), but the website can give you the price if you did not use any insurance.  - You can print the associated coupon and take it with your prescription to the pharmacy.  - You may also stop by our office during regular business hours and pick up a GoodRx coupon card.  - If you need your prescription sent electronically to a different pharmacy, notify our office through [facility] or by phone at [phone] option 4.     Si Usted Necesita Algo Despus de Su Visita  Tambin puede enviarnos un mensaje a travs de Pharmacist, community. Por lo general respondemos a los mensajes de MyChart en el transcurso de 1 a 2 das hbiles.  Para renovar recetas, por favor pida a su farmacia que se ponga en contacto con nuestra oficina. Harland Dingwall de fax es [place] [phone].  Si tiene un asunto urgente cuando la clnica est cerrada y que no puede esperar hasta el siguiente da hbil, puede llamar/localizar a su doctor(a) al nmero que aparece a continuacin.   Por favor, tenga en cuenta que aunque hacemos todo lo posible para estar disponibles para asuntos urgentes fuera del horario de [place], no estamos disponibles las 24 horas del da, los 7 das de la [place].   Si tiene un problema urgente y no puede comunicarse con nosotros, puede optar por buscar atencin mdica  en el consultorio de su doctor(a), en una clnica privada, en un centro de atencin urgente o en una sala de emergencias.  Si tiene Engineering geologist, por favor llame inmediatamente al 911 o vaya a la sala de emergencias.  Nmeros de bper  - Dr. Nehemiah Massed: [phone]  - Dra. Moye: [phone]  - Dra. Nicole Kindred: [phone]  En caso  de inclemencias del [place], por favor llame a Johnsie Kindred principal al [phone] para una actualizacin sobre el [place] de cualquier retraso o cierre.  Consejos para la medicacin en dermatologa: Por favor, guarde las cajas en las que vienen los medicamentos de uso tpico para ayudarle a seguir las instrucciones sobre dnde y cmo usarlos. Las farmacias generalmente imprimen las instrucciones del medicamento slo en las cajas y no directamente en los tubos del [place].   Si su medicamento es muy caro, por favor, pngase en contacto con Zigmund Daniel llamando al [phone] y presione la opcin 4 o envenos un mensaje a travs de Pharmacist, community.   No podemos decirle cul ser su copago por los medicamentos por adelantado ya que esto es diferente dependiendo de la cobertura de su seguro. Sin embargo, es posible que podamos encontrar un medicamento sustituto a Electrical engineer un formulario para que el seguro cubra el medicamento que se considera necesario.   Si  se requiere una autorizacin previa para que su compaa de seguros [place] su medicamento, por favor permtanos de 1 a 2 das hbiles para completar este proceso.  Los precios de los medicamentos varan con frecuencia dependiendo del Environmental consultant de dnde se surte la receta y alguna farmacias pueden ofrecer precios ms baratos.  El sitio web www.goodrx.com tiene cupones para medicamentos de Airline pilot. Los precios aqu no tienen en cuenta lo que podra costar con la ayuda del seguro (puede ser ms barato con su seguro), pero el sitio web puede darle el precio si no utiliz Research scientist (physical sciences).  - Puede imprimir el cupn correspondiente y llevarlo con su receta a la farmacia.  - Tambin puede pasar por nuestra oficina durante el horario de atencin regular y Charity fundraiser una tarjeta de cupones de GoodRx.  - Si necesita que su receta se enve electrnicamente a una farmacia diferente, informe a nuestra oficina a travs de MyChart de [place] o  por telfono llamando al [phone] y presione la opcin 4.

## 2022-10-21 NOTE — Progress Notes (Signed)
   Follow-Up Visit   Subjective  Julie Fry is a 47 y.o. female who presents for the following: Skin Discoloration (Has stopped using Hydroquinone: 12%, Kojic Acid: 6%, Niacinamide: 2%, Vitamin C: 1%. Caused redness and burning//).  The following portions of the chart were reviewed this encounter and updated as appropriate:  Tobacco  Allergies  Meds  Problems  Med Hx  Surg Hx  Fam Hx     Review of Systems: No other skin or systemic complaints except as noted in HPI or Assessment and Plan.  Objective  Well appearing patient in no apparent distress; mood and affect are within normal limits.  A focused examination was performed including face. Relevant physical exam findings are noted in the Assessment and Plan.  Head - Anterior (Face) Reticulated hyperpigmented patches.    Assessment & Plan  Melasma Head - Anterior (Face) Melasma is a chronic; persistent condition of hyperpigmented patches generally on the face, worse in summer due to higher UV exposure.    Heredity; thyroid disease; sun exposure; pregnancy; birth control pills; epilepsy medication and darker skin may predispose to Melasma.   Recommendations include: - Sun avoidance and daily broad spectrum (UVA/UVB) sunscreen SPF 30+, preferably with Zinc or Titanium Dioxide. - Rx topical bleaching creams (i.e. hydroquinone) is a common treatment but should not be used long term.  Hydroquinones may be mixed with retinoids; steroids; Kojic Acid. - Rx Azelaic Acid is also a treatment option that is safe for pregnancy (Category B). - OTC Heliocare can be helpful in control and prevention. - Oral Rx with Tranexamic Acid 250 mg - 650 mg po daily can be used for moderate to severe cases especially during summer (contraindications include pregnancy; lactation; hx of PE; DVT; clotting disorder; heart disease; anticoagulant use and upcoming long trips)   - Chemical peels (would need multiple for best result).  - Lasers and   Microdermabrasion may also be helpful adjunct treatments.  Start: Azelaic Acid: 20%, Kojic Acid: 6%, Niacinamide: 4% cream twice daily to affected areas on face.  Instructions for [facility] Medications One or more of your medications was sent to the [facility] mail order compounding pharmacy. You will receive an email from them and can purchase the medicine through that link. It will then be mailed to your home at the address you confirmed. If for any reason you do not receive an email from them, please check your spam folder. If you still do not find the email, please let us know. [facility] phone number is [phone].  Return in about 6 months (around 04/21/2023) for Melasma Follow Up.  I, Emelia Salisbury, CMA, am acting as scribe for Sarina Ser, MD. Documentation: I have reviewed the above documentation for accuracy and completeness, and I agree with the above.  Sarina Ser, MD

## 2022-10-25 ENCOUNTER — Encounter: Payer: Self-pay | Admitting: Dermatology

## 2022-11-05 ENCOUNTER — Other Ambulatory Visit: Payer: Self-pay | Admitting: Internal Medicine

## 2022-11-05 DIAGNOSIS — E559 Vitamin D deficiency, unspecified: Secondary | ICD-10-CM

## 2022-11-05 NOTE — Telephone Encounter (Signed)
Requested medication (s) are due for refill today: yes  Requested medication (s) are on the active medication list: yes  Last refill:  08/13/22 #12 0 refills  Future visit scheduled: yes in 9 months  Notes to clinic:  manual review per protocol. No refills remain. Do you want to continue refills?     Requested Prescriptions  Pending Prescriptions Disp Refills   Vitamin D, Ergocalciferol, (DRISDOL) 1.25 MG (50000 UNIT) CAPS capsule [Pharmacy Med Name: VITAMIN D2 50,000IU (ERGO) CAP RX] 12 capsule 0    Sig: TAKE 1 CAPSULE BY MOUTH EVERY 7 DAYS     Endocrinology:  Vitamins - Vitamin D Supplementation 2 Failed - 11/05/2022  3:17 AM      Failed - Manual Review: Route requests for 50,000 IU strength to the provider      Failed - Vitamin D in normal range and within 360 days    Vit D, 25-Hydroxy  Date Value Ref Range Status  08/12/2022 16 (L) 30 - 100 ng/mL Final    Comment:    Vitamin D Status         25-OH Vitamin D: . Deficiency:                    <20 ng/mL Insufficiency:             20 - 29 ng/mL Optimal:                 > or = 30 ng/mL . For 25-OH Vitamin D testing on patients on  D2-supplementation and patients for whom quantitation  of D2 and D3 fractions is required, the QuestAssureD(TM) 25-OH VIT D, (D2,D3), LC/MS/MS is recommended: order  code [id number] 1298 (patients >17yr). . See Note 1 . Note 1 . For additional information, please refer to  http://education.QuestDiagnostics.com/faq/FAQ199  (This link is being provided for informational/ educational purposes only.)          Passed - Ca in normal range and within 360 days    Calcium  Date Value Ref Range Status  08/12/2022 9.8 8.6 - 10.2 mg/dL Final         Passed - Valid encounter within last 12 months    Recent Outpatient Visits           2 months ago Annual physical exam   [facility]   1 year ago [facility] skin tag   [facility]   1 year ago [facility] physical [facility]   2 years ago Hemorrhoid prolapse   [facility]   2 years ago Adult general medical exam   [facility]       Future Appointments             In 4 months KRalene Bathe MD [place]  In 9 months TDelsa Grana [facility]

## 2023-04-03 ENCOUNTER — Ambulatory Visit: Payer: BC Managed Care – PPO | Admitting: Dermatology

## 2023-08-15 ENCOUNTER — Ambulatory Visit: Payer: Medicaid Other | Admitting: Physician Assistant

## 2024-03-09 ENCOUNTER — Ambulatory Visit (INDEPENDENT_AMBULATORY_CARE_PROVIDER_SITE_OTHER): Admitting: Family Medicine

## 2024-03-09 ENCOUNTER — Encounter: Payer: Self-pay | Admitting: Family Medicine

## 2024-03-09 VITALS — BP 120/72 | HR 63 | Resp 16 | Ht 59.0 in | Wt 108.0 lb

## 2024-03-09 DIAGNOSIS — N644 Mastodynia: Secondary | ICD-10-CM | POA: Diagnosis not present

## 2024-03-09 DIAGNOSIS — Z0000 Encounter for general adult medical examination without abnormal findings: Secondary | ICD-10-CM

## 2024-03-09 DIAGNOSIS — E559 Vitamin D deficiency, unspecified: Secondary | ICD-10-CM

## 2024-03-09 DIAGNOSIS — N912 Amenorrhea, unspecified: Secondary | ICD-10-CM | POA: Diagnosis not present

## 2024-03-09 LAB — POCT URINALYSIS DIPSTICK
Appearance: NORMAL
Bilirubin, UA: NEGATIVE
Blood, UA: NEGATIVE
Glucose, UA: NEGATIVE
Ketones, UA: NEGATIVE
Leukocytes, UA: NEGATIVE
Nitrite, UA: NEGATIVE
Protein, UA: NEGATIVE
Spec Grav, UA: 1.02 (ref 1.010–1.025)
Urobilinogen, UA: 0.2 U/dL
pH, UA: 6 (ref 5.0–8.0)

## 2024-03-09 LAB — POCT URINE PREGNANCY: Preg Test, Ur: NEGATIVE

## 2024-03-09 NOTE — Patient Instructions (Signed)
 Health Maintenance  Topic Date Due   Mammogram  Never done   COVID-19 Vaccine (3 - Moderna risk series) 08/01/2021   Flu Shot  04/23/2024   Pap with HPV screening  01/04/2026   Colon Cancer Screening  06/06/2026   DTaP/Tdap/Td vaccine (2 - Td or Tdap) 05/28/2030   Hepatitis C Screening  Completed   HIV Screening  Completed   HPV Vaccine  Aged Out   Meningitis B Vaccine  Aged Out   [facility] at [facility] [address], [place], [place] [address] Scheduling phone #: [phone]  Call to schedule mammogram

## 2024-03-09 NOTE — Progress Notes (Signed)
 Patient: Julie Fry, Female    DOB: 1975/12/05, 48 y.o.   MRN: [id number] Adeline Hone, PA-C Visit Date: 03/09/2024  Today's Provider: Adeline Hone, PA-C   [place] interpreter per phone call   Chief Complaint  Patient presents with   Annual Exam   Subjective:   Annual physical exam:  Julie Fry is a 48 y.o. female who presents today for complete physical exam:  Exercise- not active/sedentary Sleep - no trouble with sleep, she sleeps a lot 8:30 - 5:30 am Diet  - vegetables and soup  Breast pain x 2-3 months along with no period for the same time She also complains of diarrhea  At this point Cpe was stopped and pt was asked if she wished to address acute symptoms - I was asking her screening questions about CPE and she would only answer back with new acute issues and not answer the question (for example I asked if she had any concerns with mood and depression and she answered no I have breast pain, missed my period for 2-3 months and I have diarrhea) Breast pain bilateral intermittent x 3 months No period since maybe January Sexually active, not using condoms (social and sexual hx updated)     SDOH Screenings   Food Insecurity: No Food Insecurity (03/09/2024)  Housing: Unknown (03/09/2024)  Transportation Needs: No Transportation Needs (03/09/2024)  Utilities: Not At Risk (03/09/2024)  Alcohol Screen: Low Risk  (03/09/2024)  Depression (PHQ2-9): Low Risk  (03/09/2024)  Financial Resource Strain: Low Risk  (03/09/2024)  Physical Activity: Inactive (03/09/2024)  Social Connections: Socially Isolated (03/09/2024)  Stress: No Stress Concern Present (03/09/2024)  Tobacco Use: Low Risk  (03/09/2024)  Health Literacy: Adequate Health Literacy (03/09/2024)    USPSTF grade A and B recommendations - reviewed and addressed today  Depression:  Phq 9 completed today by patient, was reviewed by me with patient in the room PHQ score is neg, pt feels mood is good, no  concerns    03/09/2024    1:59 PM 08/12/2022   10:23 AM 07/17/2021    8:28 AM 01/04/2021   10:21 AM  PHQ 2/9 Scores  PHQ - 2 Score 0 0 0 0  PHQ- 9 Score  0 0       03/09/2024    1:59 PM 08/12/2022   10:23 AM 07/17/2021    8:28 AM 01/04/2021   10:21 AM 05/30/2020    8:43 AM  Depression screen PHQ 2/9  Decreased Interest 0 0 0 0 0  Down, Depressed, Hopeless 0 0 0 0 0  PHQ - 2 Score 0 0 0 0 0  Altered sleeping  0 0    Tired, decreased energy  0 0    Change in appetite  0 0    Feeling bad or failure about yourself   0 0    Trouble concentrating  0 0    Moving slowly or fidgety/restless  0 0    Suicidal thoughts  0 0    PHQ-9 Score  0 0    Difficult doing work/chores  Not difficult at all Not difficult at all      Alcohol screening: Flowsheet Row [facility] Visit [facility] 03/09/20[facility] in [facility]  AUDIT-C Score 1    Immunizations and Health Maintenance: Health Maintenance  Topic Date Due   MAMMOGRAM  Never done   COVID-19 Vaccine (3 - Moderna risk series) 08/01/2021   INFLUENZA VACCINE  04/23/2024   Cervical Cancer Screening (HPV/Pap Cotest)  01/04/2026   Colonoscopy  06/06/2026   DTaP/Tdap/Td (2 - Td or Tdap) 05/28/2030   Hepatitis C Screening  Completed   HIV Screening  Completed   HPV VACCINES  Aged Out   Meningococcal B Vaccine  Aged Out       Social History   Tobacco Use   Smoking status: Never   Smokeless tobacco: Never  Vaping Use   Vaping status: Never Used  Substance Use Topics   Alcohol use: No    Alcohol/week: 1.0 standard drink of alcohol    Types: 1 Glasses of wine per week   Drug use: No     Flowsheet Row Office Visit [facility]/09/20[facility]  AUDIT-C Score 1    History reviewed. No pertinent family history.    Social History       Social History   Socioeconomic History   Marital status: Single    Spouse name: Not on file   Number of children: Not on file   Years of education: Not  on file   Highest education level: Not on file  Occupational History   Not on file  Tobacco Use   Smoking status: Never   Smokeless tobacco: Never  Vaping Use   Vaping status: Never Used  Substance and Sexual Activity   Alcohol use: No    Alcohol/week: 1.0 standard drink of alcohol    Types: 1 Glasses of wine per week   Drug use: No   Sexual activity: Yes    Partners: Male    Birth control/protection: None  Other Topics Concern   Not on file  Social History Narrative   Not on file   Social Drivers of Health   Financial Resource Strain: Low Risk  (03/09/2024)   Overall Financial Resource Strain (CARDIA)    Difficulty of Paying Living Expenses: Not hard at all  Food Insecurity: No Food Insecurity (03/09/2024)   Hunger Vital Sign    Worried About Running Out of Food in the Last Year: Never true    Ran Out of Food in the Last Year: Never true  Transportation Needs: No Transportation Needs (03/09/2024)   PRAPARE - Administrator, Civil Service (Medical): No    Lack of Transportation (Non-Medical): No  Physical Activity: Inactive (03/09/2024)   Exercise Vital Sign    Days of Exercise per Week: 0 days    Minutes of Exercise per Session: 0 min  Stress: No Stress Concern Present (03/09/2024)   [employer] of Occupational Health - Occupational Stress Questionnaire    Feeling of Stress: Not at all  Social Connections: Socially Isolated (03/09/2024)   Social Connection and Isolation Panel    Frequency of Communication with Friends and Family: More than three times a week    Frequency of Social Gatherings with Friends and Family: More than three times a week    Attends Religious Services: Never    Database administrator or Organizations: No    Attends Banker Meetings: Never    Marital Status: Never married    Family History       History reviewed. No pertinent family history.  Patient Active Problem List   Diagnosis Date Noted   Anal skin tag  07/17/2021   Screening for colon cancer    Separation of symphysis pubis during delivery 04/16/2018   Advanced maternal age in multigravida, second trimester    Supervision of other high risk pregnancies, third trimester 10/29/2017  Past Surgical History:  Procedure Laterality Date   COLONOSCOPY WITH PROPOFOL  N/A 06/06/2021   Procedure: COLONOSCOPY WITH PROPOFOL ;  Surgeon: Selena Daily, MD;  Location: [facility] ENDOSCOPY;  Service: Gastroenterology;  Laterality: N/A;   DILATION AND EVACUATION N/A 08/07/2015   Procedure: DILATATION AND EVACUATION;  Surgeon: Carolynn Citrin, MD;  Location: [facility] ORS;  Service: Gynecology;  Laterality: N/A;    No current outpatient medications on file.  No Known Allergies  Patient Care Team: Anavictoria Wilk, PA-C as PCP - General (Family Medicine)   Chart Review: I personally reviewed active problem list, medication list, allergies, family history, social history, health maintenance, notes from last encounter, lab results, imaging with the patient/caregiver today.   Review of Systems  Constitutional: Negative.   HENT: Negative.    Eyes: Negative.   Respiratory: Negative.    Cardiovascular: Negative.   Gastrointestinal: Negative.   Endocrine: Negative.   Genitourinary: Negative.   Musculoskeletal: Negative.   Skin: Negative.   Allergic/Immunologic: Negative.   Neurological: Negative.   Hematological: Negative.   Psychiatric/Behavioral: Negative.    All other systems reviewed and are negative.         Objective:   Vitals:  Vitals:   03/09/24 1359  BP: 120/72  Pulse: 63  Resp: 16  SpO2: 97%  Weight: 108 lb (49 kg)  Height: 4' 11 (1.499 m)    Body mass index is 21.81 kg/m.  Physical Exam Vitals and nursing note reviewed.  Constitutional:      General: She is not in acute distress.    Appearance: Normal appearance. She is well-developed. She is not ill-appearing, toxic-appearing or diaphoretic.  HENT:     Head:  Normocephalic and atraumatic.     Right Ear: External ear normal.     Left Ear: External ear normal.     Nose: Nose normal.   Eyes:     General: No scleral icterus.       Right eye: No discharge.        Left eye: No discharge.     Conjunctiva/sclera: Conjunctivae normal.   Neck:     Trachea: No tracheal deviation.   Cardiovascular:     Rate and Rhythm: Normal rate.  Pulmonary:     Effort: Pulmonary effort is normal. No respiratory distress.     Breath sounds: No stridor.  Chest:  Breasts:    Breasts are symmetrical.     Right: Tenderness present. No swelling, bleeding, inverted nipple, mass, nipple discharge or skin change.     Left: Normal.  Lymphadenopathy:     Upper Body:     Right upper body: No supraclavicular, axillary or pectoral adenopathy.     Left upper body: No supraclavicular, axillary or pectoral adenopathy.   Skin:    General: Skin is warm and dry.     Findings: No rash.   Neurological:     Mental Status: She is alert.     Motor: No abnormal muscle tone.     Coordination: Coordination normal.     Gait: Gait normal.   Psychiatric:        Mood and Affect: Mood normal.        Behavior: Behavior normal.       Functional Status Survey: Is the patient deaf or have difficulty hearing?: No Does the patient have difficulty seeing, even when wearing glasses/contacts?: No Does the patient have difficulty concentrating, remembering, or making decisions?: No Does the patient have difficulty walking or climbing stairs?: No Does  the patient have difficulty dressing or bathing?: No Does the patient have difficulty doing errands alone such as visiting a doctor's office or shopping?: No   Assessment & Plan:        ICD-10-CM   1. Breast pain  N64.4 MM 3D DIAGNOSTIC MAMMOGRAM BILATERAL BREAST    US  LIMITED ULTRASOUND INCLUDING AXILLA LEFT BREAST     US  LIMITED ULTRASOUND INCLUDING AXILLA RIGHT BREAST    CANCELED: US  BREAST COMPLETE UNI LEFT INC AXILLA     CANCELED: US  BREAST COMPLETE UNI RIGHT INC AXILLA   bilateral x 2-3 months, mildly tender to breast tissue to right breast, no masses and skin abnormality    2. Amenorrhea  N91.2 POCT Pregnancy, Urine    POCT Urinalysis Dipstick    POCT urine pregnancy   no mentrual cycle for several months, associated breast pain, sexually active w/o condoms or birth control     Reschedule CPE     Adeline Hone, PA-C 03/09/24 2:26 PM  [facility]

## 2024-03-24 NOTE — Progress Notes (Signed)
 Patient: Julie Fry, Female    DOB: 10-28-1975, 48 y.o.   MRN: [id number] Leavy Mole, PA-C Visit Date: 03/25/2024  Today's Provider: Mole Leavy, PA-C   Chief Complaint  Patient presents with   Annual Exam   Subjective:   Annual physical exam:  Julie Fry is a 48 y.o. female who presents today for complete physical exam:  Exercise- not active/sedentary Sleep - no trouble with sleep, she sleeps a lot 8:30 - 5:30 am Diet  - vegetables and soup  SDOH Screenings   Food Insecurity: No Food Insecurity (03/09/2024)  Housing: Unknown (03/09/2024)  Transportation Needs: No Transportation Needs (03/09/2024)  Utilities: Not At Risk (03/09/2024)  Alcohol Screen: Low Risk  (03/09/2024)  Depression (PHQ2-9): Low Risk  (03/25/2024)  Financial Resource Strain: Low Risk  (03/09/2024)  Physical Activity: Inactive (03/09/2024)  Social Connections: Socially Isolated (03/09/2024)  Stress: No Stress Concern Present (03/09/2024)  Tobacco Use: Low Risk  (03/25/2024)  Health Literacy: Adequate Health Literacy (03/09/2024)     USPSTF grade A and B recommendations - reviewed and addressed today  Depression:  Phq 9 completed today by patient, was reviewed by me with patient in the room PHQ score is neg, pt feels good     03/25/2024    8:24 AM 03/09/2024    1:59 PM 08/12/2022   10:23 AM 07/17/2021    8:28 AM  PHQ 2/9 Scores  PHQ - 2 Score 0 0 0 0  PHQ- 9 Score   0 0      03/25/2024    8:24 AM 03/09/2024    1:59 PM 08/12/2022   10:23 AM 07/17/2021    8:28 AM 01/04/2021   10:21 AM  Depression screen PHQ 2/9  Decreased Interest 0 0 0 0 0  Down, Depressed, Hopeless 0 0 0 0 0  PHQ - 2 Score 0 0 0 0 0  Altered sleeping   0 0   Tired, decreased energy   0 0   Change in appetite   0 0   Feeling bad or failure about yourself    0 0   Trouble concentrating   0 0   Moving slowly or fidgety/restless   0 0   Suicidal thoughts   0 0   PHQ-9 Score   0 0   Difficult doing work/chores    Not difficult at all Not difficult at all     Alcohol screening: Flowsheet Row Office Visit from 03/09/2024 in [facility]  AUDIT-C Score 1    Immunizations and Health Maintenance: Health Maintenance  Topic Date Due   COVID-19 Vaccine (3 - Moderna risk series) 04/09/2024 (Originally 08/01/2021)   Hepatitis B Vaccines (1 of 3 - 19+ 3-dose series) 03/24/2025 (Originally 05/03/1995)   MAMMOGRAM  03/24/2025 (Originally 05/02/1994)   INFLUENZA VACCINE  04/23/2024   Cervical Cancer Screening (HPV/Pap Cotest)  01/04/2026   Colonoscopy  06/06/2026   DTaP/Tdap/Td (2 - Td or Tdap) 05/28/2030   Hepatitis C Screening  Completed   HIV Screening  Completed   HPV VACCINES  Aged Out   Meningococcal B Vaccine  Aged Out     Hep C Screening: done  STD testing and prevention (HIV/chl/gon/syphilis):  will do STD screening today, sexually   Intimate partner violence:   safe at home   Sexual History/Pain during Intercourse:    Menstrual History/LMP/Abnormal Bleeding:  Patient's last menstrual period was 03/14/2024 (approximate).  Incontinence Symptoms: mild stress incontinence   Breast cancer:  due  Last Mammogram: *see HM list above  Cervical cancer screening: UTD, due 2027  Osteoporosis:   Discussion on osteoporosis per age, including high calcium and vitamin D  supplementation, weight bearing exercises  Skin cancer:  Hx of skin CA -  NO Discussed atypical lesions   Colorectal cancer:   Colonoscopy is UTD due in 2027 Discussed concerning signs and sx of CRC, bowels normal, no blood in stool, no weight loss  Lung cancer:   Low Dose CT Chest recommended if Age 52-80 years, 20 pack-year currently smoking OR have quit w/in 15years. Patient does not qualify.    Social History   Tobacco Use   Smoking status: Never   Smokeless tobacco: Never  Vaping Use   Vaping status: Never Used  Substance Use Topics   Alcohol use: No    Alcohol/week: 1.0 standard drink of  alcohol    Types: 1 Glasses of wine per week   Drug use: No     Flowsheet Row Office Visit from 03/09/2024 in [facility]  AUDIT-C Score 1    History reviewed. No pertinent family history.   Blood pressure/Hypertension: BP Readings from Last 3 Encounters:  03/25/24 124/74  03/09/24 120/72  10/21/22 122/80    Weight/Obesity: Wt Readings from Last 3 Encounters:  03/25/24 108 lb (49 kg)  03/09/24 108 lb (49 kg)  08/12/22 107 lb 12.8 oz (48.9 kg)   BMI Readings from Last 3 Encounters:  03/25/24 21.81 kg/m  03/09/24 21.81 kg/m  08/12/22 21.77 kg/m     Lipids:  Lab Results  Component Value Date   CHOL 213 (H) 08/12/2022   CHOL 188 01/04/2021   Lab Results  Component Value Date   HDL 68 08/12/2022   HDL 64 01/04/2021   Lab Results  Component Value Date   LDLCALC 123 (H) 08/12/2022   LDLCALC 106 (H) 01/04/2021   Lab Results  Component Value Date   TRIG 109 08/12/2022   TRIG 85 01/04/2021   Lab Results  Component Value Date   CHOLHDL 3.1 08/12/2022   CHOLHDL 2.9 01/04/2021   No results found for: LDLDIRECT Based on the results of lipid panel his/her cardiovascular risk factor ( using Poole Cohort )  in the next 10 years is: The 10-year ASCVD risk score (Arnett DK, et al., 2019) is: 0.7%   Values used to calculate the score:     Age: 60 years     Clincally relevant sex: Female     Is Non-Hispanic African American: No     Diabetic: No     Tobacco smoker: No     Systolic Blood Pressure: 124 mmHg     Is BP treated: No     HDL Cholesterol: 68 mg/dL     Total Cholesterol: 213 mg/dL  Glucose:  Glucose, Bld  Date Value Ref Range Status  08/12/2022 85 65 - 99 mg/dL Final    Comment:    .            Fasting reference interval .   08/07/2015 97 65 - 99 mg/dL Final    Advanced Care Planning:  A voluntary discussion about advance care planning including the explanation and discussion of advance directives.   Discussed health  care proxy and Living will, and the patient was able to identify a health care proxy as husband.   Patient does not have a living will at present time.   Social History       Social History  Socioeconomic History   Marital status: Married    Spouse name: Not on file   Number of children: 1   Years of education: Not on file   Highest education level: Not on file  Occupational History   Not on file  Tobacco Use   Smoking status: Never   Smokeless tobacco: Never  Vaping Use   Vaping status: Never Used  Substance and Sexual Activity   Alcohol use: No    Alcohol/week: 1.0 standard drink of alcohol    Types: 1 Glasses of wine per week   Drug use: No   Sexual activity: Yes    Partners: Male    Birth control/protection: None  Other Topics Concern   Not on file  Social History Narrative   Married, your daughter 49 y/o and 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    Social Drivers of Health   Financial Resource Strain: Low Risk  (03/09/2024)   Overall Financial Resource Strain (CARDIA)    Difficulty of Paying Living Expenses: Not hard at all  Food Insecurity: No Food Insecurity (03/09/2024)   Hunger Vital Sign    Worried About Running Out of Food in the Last Year: Never true    Ran Out of Food in the Last Year: Never true  Transportation Needs: No Transportation Needs (03/09/2024)   PRAPARE - Administrator, Civil Service (Medical): No    Lack of Transportation (Non-Medical): No  Physical Activity: Inactive (03/09/2024)   Exercise Vital Sign    Days of Exercise per Week: 0 days    Minutes of Exercise per Session: 0 min  Stress: No Stress Concern Present (03/09/2024)   [employer] of Occupational Health - Occupational Stress Questionnaire    Feeling of Stress: Not at all  Social Connections: Socially Isolated (03/09/2024)   Social Connection and Isolation Panel    Frequency of Communication with Friends and Family: More than three times a week    Frequency of Social Gatherings with  Friends and Family: More than three times a week    Attends Religious Services: Never    Database administrator or Organizations: No    Attends Banker Meetings: Never    Marital Status: Never married    Family History       History reviewed. No pertinent family history.  Patient Active Problem List   Diagnosis Date Noted   Anal skin tag 07/17/2021   Screening for colon cancer    Separation of symphysis pubis during delivery 04/16/2018   Advanced maternal age in multigravida, second trimester    Supervision of other high risk pregnancies, third trimester 10/29/2017    Past Surgical History:  Procedure Laterality Date   COLONOSCOPY WITH PROPOFOL  N/A 06/06/2021   Procedure: COLONOSCOPY WITH PROPOFOL ;  Surgeon: Unk Corinn Skiff, MD;  Location: [facility] ENDOSCOPY;  Service: Gastroenterology;  Laterality: N/A;   DILATION AND EVACUATION N/A 08/07/2015   Procedure: DILATATION AND EVACUATION;  Surgeon: Debby JINNY Dinsmore, MD;  Location: [facility] ORS;  Service: Gynecology;  Laterality: N/A;    No current outpatient medications on file.  No Known Allergies  Patient Care Team: Dahna Hattabaugh, PA-C as PCP - General (Family Medicine)   Chart Review: I personally reviewed active problem list, medication list, allergies, family history, social history, health maintenance, notes from last encounter, lab results, imaging with the patient/caregiver today.    Review of Systems  Constitutional: Negative.   HENT: Negative.    Eyes: Negative.   Respiratory: Negative.  Cardiovascular: Negative.   Gastrointestinal: Negative.   Endocrine: Negative.   Genitourinary: Negative.   Musculoskeletal: Negative.   Skin: Negative.   Allergic/Immunologic: Negative.   Neurological: Negative.   Hematological: Negative.   Psychiatric/Behavioral: Negative.    All other systems reviewed and are negative.         Objective:   Vitals:  Vitals:   03/25/24 0824  BP: 124/74  Pulse: 75   Resp: 16  SpO2: 98%  Weight: 108 lb (49 kg)  Height: 4' 11 (1.499 m)    Body mass index is 21.81 kg/m.  Physical Exam Vitals and nursing note reviewed.  Constitutional:      General: She is not in acute distress.    Appearance: Normal appearance. She is well-developed and normal weight. She is not ill-appearing, toxic-appearing or diaphoretic.  HENT:     Head: Normocephalic and atraumatic.     Right Ear: Tympanic membrane, ear canal and external ear normal.     Left Ear: Tympanic membrane, ear canal and external ear normal.     Nose: Nose normal.     Mouth/Throat:     Mouth: Mucous membranes are moist.     Pharynx: Oropharynx is clear.  Eyes:     General: No scleral icterus.       Right eye: No discharge.        Left eye: No discharge.     Conjunctiva/sclera: Conjunctivae normal.  Neck:     Trachea: No tracheal deviation.  Cardiovascular:     Rate and Rhythm: Normal rate and regular rhythm.     Pulses: Normal pulses.     Heart sounds: Normal heart sounds.  Pulmonary:     Effort: Pulmonary effort is normal. No respiratory distress.     Breath sounds: Normal breath sounds. No stridor. No wheezing, rhonchi or rales.  Abdominal:     General: Bowel sounds are normal. There is no distension.     Palpations: Abdomen is soft.  Musculoskeletal:     Cervical back: Normal range of motion.     Right lower leg: No edema.     Left lower leg: No edema.  Skin:    General: Skin is warm and dry.     Capillary Refill: Capillary refill takes less than 2 seconds.     Findings: No rash.  Neurological:     Mental Status: She is alert. Mental status is at baseline.     Motor: No abnormal muscle tone.     Gait: Gait normal.  Psychiatric:        Mood and Affect: Mood normal.        Behavior: Behavior normal.       Fall Risk:    03/25/2024    8:24 AM 03/09/2024    1:59 PM 08/12/2022   10:23 AM 07/31/2021    2:43 PM 07/17/2021    8:28 AM  Fall Risk   Falls in the past year? 0 0  0 0 0  Number falls in past yr: 0 0 0  0  Injury with Fall? 0 0 0  0  Risk for fall due to : No Fall Risks No Fall Risks   No Fall Risks  Follow up Falls prevention discussed Falls prevention discussed   Falls prevention discussed      Data saved with a previous flowsheet row definition    Functional Status Survey: Is the patient deaf or have difficulty hearing?: No Does the patient have difficulty seeing, even when  wearing glasses/contacts?: No Does the patient have difficulty concentrating, remembering, or making decisions?: No Does the patient have difficulty walking or climbing stairs?: No Does the patient have difficulty dressing or bathing?: No Does the patient have difficulty doing errands alone such as visiting a doctor's office or shopping?: No   Assessment & Plan:    CPE completed today  USPSTF grade A and B recommendations reviewed with patient; age-appropriate recommendations, preventive care, screening tests, etc discussed and encouraged; healthy living encouraged; see AVS for patient education given to patient  Discussed importance of 150 minutes of physical activity weekly, AHA exercise recommendations given to pt in AVS/handout  Discussed importance of healthy diet:  eating lean meats and proteins, avoiding trans fats and saturated fats, avoid simple sugars and excessive carbs in diet, eat 6 servings of fruit/vegetables daily and drink plenty of water and avoid sweet beverages.    Recommended pt to do annual eye exam and routine dental exams/cleanings  Depression, alcohol, fall screening completed as documented above and per flowsheets  Advance Care planning information and packet discussed and offered today, encouraged pt to discuss with family members/spouse/partner/friends and complete Advanced directive packet and bring copy to office   Reviewed Health Maintenance: Health Maintenance  Topic Date Due   COVID-19 Vaccine (3 - Moderna risk series) 04/09/2024  (Originally 08/01/2021)   Hepatitis B Vaccines (1 of 3 - 19+ 3-dose series) 03/24/2025 (Originally 05/03/1995)   MAMMOGRAM  03/24/2025 (Originally 05/02/1994)   INFLUENZA VACCINE  04/23/2024   Cervical Cancer Screening (HPV/Pap Cotest)  01/04/2026   Colonoscopy  06/06/2026   DTaP/Tdap/Td (2 - Td or Tdap) 05/28/2030   Hepatitis C Screening  Completed   HIV Screening  Completed   HPV VACCINES  Aged Out   Meningococcal B Vaccine  Aged Out    Immunizations: Immunization History  Administered Date(s) Administered   Moderna Sars-Covid-2 Vaccination 06/01/2021, 07/04/2021   Tdap 05/28/2020   Vaccines:  HPV: up to at age 3 , ask insurance if age between 5-45  Shingrix: not due per age 33-64 yo and ask insurance if covered when patient above 36 yo Pneumonia: n/a educated and discussed with patient. Flu: out of season educated and discussed with patient. COVID:      ICD-10-CM   1. Well adult exam  Z00.00 CBC with Differential/Platelet    Comprehensive metabolic panel with GFR    Hemoglobin A1c    TSH    Lipid panel    CANCELED: Cervicovaginal ancillary only    2. Screening examination for STD (sexually transmitted disease)  Z11.3 CANCELED: Cervicovaginal ancillary only    3. Encounter for screening mammogram for malignant neoplasm of breast  Z12.31 MM 3D SCREENING MAMMOGRAM BILATERAL BREAST          Michelene Cower, PA-C 03/25/24 8:43 AM  [facility]

## 2024-03-25 ENCOUNTER — Ambulatory Visit: Admitting: Family Medicine

## 2024-03-25 ENCOUNTER — Encounter: Payer: Self-pay | Admitting: Family Medicine

## 2024-03-25 VITALS — BP 124/74 | HR 75 | Resp 16 | Ht 59.0 in | Wt 108.0 lb

## 2024-03-25 DIAGNOSIS — Z1231 Encounter for screening mammogram for malignant neoplasm of breast: Secondary | ICD-10-CM | POA: Diagnosis not present

## 2024-03-25 DIAGNOSIS — Z0000 Encounter for general adult medical examination without abnormal findings: Secondary | ICD-10-CM | POA: Diagnosis not present

## 2024-03-25 DIAGNOSIS — Z113 Encounter for screening for infections with a predominantly sexual mode of transmission: Secondary | ICD-10-CM | POA: Diagnosis not present

## 2024-03-25 NOTE — Patient Instructions (Addendum)
 Call to schedule mammogram  [facility] [address], [place], [place] [address] Scheduling phone #: [phone]  When your periods are stopping you should start doing daily supplements for your bones to prevent osteoporosis - calcium [phone] mg daily and vit D 3 1000-2000 daily   Health Maintenance  Topic Date Due   COVID-19 Vaccine (3 - Moderna risk series) 04/09/2024*   Hepatitis B Vaccine (1 of 3 - 19+ 3-dose series) 03/24/2025*   Mammogram  03/24/2025*   Flu Shot  04/23/2024   Pap with HPV screening  01/04/2026   Colon Cancer Screening  06/06/2026   DTaP/Tdap/Td vaccine (2 - Td or Tdap) 05/28/2030   Hepatitis C Screening  Completed   HIV Screening  Completed   HPV Vaccine  Aged Out   Meningitis B Vaccine  Aged Out  *Topic was postponed. The date shown is not the original due date.

## 2024-03-26 LAB — CBC WITH DIFFERENTIAL/PLATELET
Absolute Lymphocytes: 1915 {cells}/uL (ref 850–3900)
Absolute Monocytes: 319 {cells}/uL (ref 200–950)
Basophils Absolute: 51 {cells}/uL (ref 0–200)
Basophils Relative: 0.9 %
Eosinophils Absolute: 251 {cells}/uL (ref 15–500)
Eosinophils Relative: 4.4 %
HCT: 43.3 % (ref 35.0–45.0)
Hemoglobin: 13.7 g/dL (ref 11.7–15.5)
MCH: 28.5 pg (ref 27.0–33.0)
MCHC: 31.6 g/dL — ABNORMAL LOW (ref 32.0–36.0)
MCV: 90 fL (ref 80.0–100.0)
MPV: 10.9 fL (ref 7.5–12.5)
Monocytes Relative: 5.6 %
Neutro Abs: 3164 {cells}/uL (ref 1500–7800)
Neutrophils Relative %: 55.5 %
Platelets: 289 Thousand/uL (ref 140–400)
RBC: 4.81 Million/uL (ref 3.80–5.10)
RDW: 13.1 % (ref 11.0–15.0)
Total Lymphocyte: 33.6 %
WBC: 5.7 Thousand/uL (ref 3.8–10.8)

## 2024-03-26 LAB — HEMOGLOBIN A1C
Hgb A1c MFr Bld: 5.4 % (ref <5.7)
Mean Plasma Glucose: 108 mg/dL
eAG (mmol/L): 6 mmol/L

## 2024-03-26 LAB — COMPREHENSIVE METABOLIC PANEL WITH GFR
AG Ratio: 1.3 (calc) (ref 1.0–2.5)
ALT: 15 U/L (ref 6–29)
AST: 19 U/L (ref 10–35)
Albumin: 4.4 g/dL (ref 3.6–5.1)
Alkaline phosphatase (APISO): 62 U/L (ref 31–125)
BUN: 13 mg/dL (ref 7–25)
CO2: 27 mmol/L (ref 20–32)
Calcium: 9.7 mg/dL (ref 8.6–10.2)
Chloride: 105 mmol/L (ref 98–110)
Creat: 0.59 mg/dL (ref 0.50–0.99)
Globulin: 3.3 g/dL (ref 1.9–3.7)
Glucose, Bld: 87 mg/dL (ref 65–99)
Potassium: 4.9 mmol/L (ref 3.5–5.3)
Sodium: 137 mmol/L (ref 135–146)
Total Bilirubin: 0.9 mg/dL (ref 0.2–1.2)
Total Protein: 7.7 g/dL (ref 6.1–8.1)
eGFR: 112 mL/min/1.73m2 (ref >=60)

## 2024-03-26 LAB — LIPID PANEL
Cholesterol: 197 mg/dL (ref <200)
HDL: 78 mg/dL (ref >=50)
LDL Cholesterol (Calc): 99 mg/dL
Non-HDL Cholesterol (Calc): 119 mg/dL (ref <130)
Total CHOL/HDL Ratio: 2.5 (calc) (ref <5.0)
Triglycerides: 102 mg/dL (ref <150)

## 2024-03-26 LAB — TSH: TSH: 1.6 m[IU]/L

## 2024-03-29 ENCOUNTER — Ambulatory Visit: Payer: Self-pay | Admitting: Family Medicine

## 2024-09-28 ENCOUNTER — Ambulatory Visit: Admitting: Family Medicine

## 2024-09-30 ENCOUNTER — Ambulatory Visit: Payer: Self-pay | Admitting: Family Medicine

## 2024-09-30 ENCOUNTER — Encounter: Payer: Self-pay | Admitting: Family Medicine

## 2024-09-30 ENCOUNTER — Ambulatory Visit: Payer: Self-pay

## 2024-09-30 VITALS — BP 122/70 | HR 65 | Resp 16 | Ht 59.0 in | Wt 110.0 lb

## 2024-09-30 DIAGNOSIS — N926 Irregular menstruation, unspecified: Secondary | ICD-10-CM

## 2024-09-30 DIAGNOSIS — Z1231 Encounter for screening mammogram for malignant neoplasm of breast: Secondary | ICD-10-CM

## 2024-09-30 DIAGNOSIS — E7800 Pure hypercholesterolemia, unspecified: Secondary | ICD-10-CM | POA: Insufficient documentation

## 2024-09-30 DIAGNOSIS — E559 Vitamin D deficiency, unspecified: Secondary | ICD-10-CM | POA: Insufficient documentation

## 2024-09-30 DIAGNOSIS — Z0000 Encounter for general adult medical examination without abnormal findings: Secondary | ICD-10-CM

## 2024-09-30 LAB — POCT URINE PREGNANCY: Preg Test, Ur: NEGATIVE

## 2024-09-30 NOTE — Progress Notes (Signed)
 "  Established Patient Office Visit  Subjective   Patient ID: Julie Fry, female    DOB: 04-May-1976  Age: 49 y.o. MRN: [id number]  Chief Complaint  Patient presents with   Transitions Of Care    HPI  Patient is a pleasant 49 year old female who is seen today for transition of care. She is a new patient to me. Translator services used during today's visit.   She voices she is doing well today. She denies concerns or other complaints. She does report that she is not fasting, labs deferred.   During today's visit healthcare maintenance and preventative screening items addressed includes pap smear, mammogram, and colonoscopy.    Review of Systems  Constitutional:  Negative for malaise/fatigue.  Respiratory:  Negative for shortness of breath.   Cardiovascular:  Negative for chest pain.  Neurological:  Negative for dizziness and headaches.  All other systems reviewed and are negative.     Objective:     BP 122/70   Pulse 65   Resp 16   Ht 4' 11 (1.499 m)   Wt 110 lb (49.9 kg)   SpO2 99%   BMI 22.22 kg/m  BP Readings from Last 3 Encounters:  09/30/24 122/70  03/25/24 124/74  03/09/24 120/72   Wt Readings from Last 3 Encounters:  09/30/24 110 lb (49.9 kg)  03/25/24 108 lb (49 kg)  03/09/24 108 lb (49 kg)      Physical Exam Constitutional:      General: She is not in acute distress.    Appearance: Normal appearance.  Cardiovascular:     Rate and Rhythm: Normal rate and regular rhythm.     Heart sounds: Normal heart sounds.  Pulmonary:     Effort: Pulmonary effort is normal.     Breath sounds: Normal breath sounds. No wheezing, rhonchi or rales.  Skin:    General: Skin is warm and dry.  Neurological:     General: No focal deficit present.     Mental Status: She is alert.  Psychiatric:        Mood and Affect: Mood normal.        Behavior: Behavior normal.      Results for orders placed or performed in visit on 09/30/24  POCT urine pregnancy   Result Value Ref Range   Preg Test, Ur Negative Negative    Last CBC Lab Results  Component Value Date   WBC 5.7 03/25/2024   HGB 13.7 03/25/2024   HCT 43.3 03/25/2024   MCV 90.0 03/25/2024   MCH 28.5 03/25/2024   RDW 13.1 03/25/2024   PLT 289 03/25/2024   Last metabolic panel Lab Results  Component Value Date   GLUCOSE 87 03/25/2024   NA 137 03/25/2024   K 4.9 03/25/2024   CL 105 03/25/2024   CO2 27 03/25/2024   BUN 13 03/25/2024   CREATININE 0.59 03/25/2024   EGFR 112 03/25/2024   CALCIUM 9.7 03/25/2024   PROT 7.7 03/25/2024   BILITOT 0.9 03/25/2024   AST 19 03/25/2024   ALT 15 03/25/2024   ANIONGAP 3 (L) 08/07/2015   Last lipids Lab Results  Component Value Date   CHOL 197 03/25/2024   HDL 78 03/25/2024   LDLCALC 99 03/25/2024   TRIG 102 03/25/2024   CHOLHDL 2.5 03/25/2024   Last hemoglobin A1c Lab Results  Component Value Date   HGBA1C 5.4 03/25/2024   Last vitamin D  Lab Results  Component Value Date   VD25OH 16 (L) 08/12/2022  Assessment & Plan:   Assessment & Plan Hypercholesterolemia Hypercholesterolemia managed with diet and lifestyle. Last lipid panel normal, LDL of 99 03/2024.   -Advised to continue with dietary changes limiting fried, greasy foods and other processed foods -Requested to return fasting to 6 month f/u for updated labs. She is receptive.     Vitamin D  deficiency Confirmed vitamin D  deficiency, previously uncontrolled. Last vitamin D  level 16 in 07/2022. She reports she does not currently take an OTC vitamin D  supplement.   -Recommended OTC vitamin D3 2,000 international units once daily -Repeat labs at time of 6 month f/u     Menstrual periods irregular She reports irregular menstrual periods. LMP approximately 5 months ago. She denies pelvic pain or lower abdominal pain. She states prior to 5 months ago menstrual periods were regular occurring once monthly. She does endorse associated hot flashes. She denies  fever, fatigue or unintentional weight loss.   She is sexually active therefore urine pregnancy test completed. Urine pregnancy in office negative.  Suspect that irregular menstrual periods are s/t perimenopause.   -6 month f/u planned -Return precautions advised Orders:   POCT urine pregnancy  Encounter for screening mammogram for malignant neoplasm of breast Mammogram not UTD, mammogram ordered.  She denies breast pain or other related concerns.  Orders:   MM 3D SCREENING MAMMOGRAM BILATERAL BREAST; Future  Healthcare maintenance -Colonoscopy UTD, completed in 05/2021. Recommended 5 year repeat for screening purposes -Mammogram not UTD, mammogram ordered today -Pap smear not UTD, last pap 2022 NIL. Plan to complete pap at 6 month f/u for her annual physical -Schedule 6 month f/u for annual physical. Requested to return fasting for fasting labs.         Return in about 6 months (around 03/30/2025) for Annual physical with pap. Please return fasting for labs. SABRA    LAYMON LOISE CORE, FNP "

## 2024-09-30 NOTE — Assessment & Plan Note (Signed)
 Confirmed vitamin D  deficiency, previously uncontrolled. Last vitamin D  level 16 in 07/2022. She reports she does not currently take an OTC vitamin D  supplement.   -Recommended OTC vitamin D3 2,000 international units once daily -Repeat labs at time of 6 month f/u

## 2024-09-30 NOTE — Assessment & Plan Note (Signed)
 Hypercholesterolemia managed with diet and lifestyle. Last lipid panel normal, LDL of 99 03/2024.   -Advised to continue with dietary changes limiting fried, greasy foods and other processed foods -Requested to return fasting to 6 month f/u for updated labs. She is receptive.

## 2024-10-28 ENCOUNTER — Encounter: Payer: Self-pay | Admitting: Radiology

## 2024-10-28 ENCOUNTER — Ambulatory Visit
Admission: RE | Admit: 2024-10-28 | Discharge: 2024-10-28 | Disposition: A | Payer: Self-pay | Source: Ambulatory Visit | Attending: Family Medicine | Admitting: Family Medicine

## 2024-10-28 DIAGNOSIS — Z1231 Encounter for screening mammogram for malignant neoplasm of breast: Secondary | ICD-10-CM

## 2025-03-30 ENCOUNTER — Encounter: Payer: Self-pay | Admitting: Family Medicine
